# Patient Record
Sex: Female | Born: 1985 | Race: Asian | Hispanic: No | Marital: Married | Smoking: Never smoker
Health system: Southern US, Community
[De-identification: ages and names within clinical notes are randomized; demographics above are authoritative.]

## PROBLEM LIST (undated history)

## (undated) DIAGNOSIS — I10 Essential (primary) hypertension: Secondary | ICD-10-CM

## (undated) DIAGNOSIS — E119 Type 2 diabetes mellitus without complications: Secondary | ICD-10-CM

## (undated) DIAGNOSIS — E2839 Other primary ovarian failure: Secondary | ICD-10-CM

## (undated) DIAGNOSIS — Z79890 Hormone replacement therapy (postmenopausal): Secondary | ICD-10-CM

## (undated) DIAGNOSIS — E288 Other ovarian dysfunction: Secondary | ICD-10-CM

## (undated) HISTORY — PX: APPENDECTOMY: SHX54

## (undated) HISTORY — DX: Type 2 diabetes mellitus without complications: E11.9

## (undated) HISTORY — DX: Hormone replacement therapy: Z79.890

---

## 2016-11-26 ENCOUNTER — Encounter: Payer: Self-pay | Admitting: Family Medicine

## 2016-11-26 ENCOUNTER — Ambulatory Visit (INDEPENDENT_AMBULATORY_CARE_PROVIDER_SITE_OTHER): Payer: 59 | Admitting: Family Medicine

## 2016-11-26 VITALS — BP 110/62 | HR 72 | Ht 62.0 in | Wt 161.0 lb

## 2016-11-26 DIAGNOSIS — Z7689 Persons encountering health services in other specified circumstances: Secondary | ICD-10-CM | POA: Diagnosis not present

## 2016-11-26 DIAGNOSIS — E2839 Other primary ovarian failure: Secondary | ICD-10-CM | POA: Diagnosis not present

## 2016-11-26 DIAGNOSIS — E049 Nontoxic goiter, unspecified: Secondary | ICD-10-CM

## 2016-11-26 DIAGNOSIS — E119 Type 2 diabetes mellitus without complications: Secondary | ICD-10-CM | POA: Diagnosis not present

## 2016-11-26 DIAGNOSIS — N970 Female infertility associated with anovulation: Secondary | ICD-10-CM | POA: Diagnosis not present

## 2016-11-26 MED ORDER — METFORMIN HCL 500 MG PO TABS: 500.0000 mg | ORAL_TABLET | Freq: Two times a day (BID) | ORAL | 6 refills | Status: DC

## 2016-11-26 NOTE — Progress Notes (Signed)
Name: [name]   MRN: [id number]    DOB: [birth year]   Date:11/26/2016       Progress Note  Subjective  Chief Complaint  Chief Complaint  Patient presents with  . Establish Care  . Diabetes    needs Metformin refilled  . hormone replacement therapy    currently using a cream    Diabetes  She presents for her follow-up diabetic visit. She has type 2 diabetes mellitus. Her disease course has been stable. There are no hypoglycemic associated symptoms. Pertinent negatives for hypoglycemia include no dizziness, headaches or nervousness/anxiousness. Pertinent negatives for diabetes include no blurred vision, no chest pain, no fatigue, no foot paresthesias, no foot ulcerations, no polydipsia, no polyphagia, no polyuria, no visual change, no weakness and no weight loss. There are no hypoglycemic complications. Symptoms are stable. There are no diabetic complications. Risk factors for coronary artery disease include diabetes mellitus. Current diabetic treatment includes oral agent (monotherapy). Her weight is stable. She participates in exercise daily. Her breakfast blood glucose is taken between 7-8 am.    No problem-specific Assessment & Plan notes found for this encounter.   Past Medical History:  Diagnosis Date  . Diabetes mellitus without complication (HCC)   . Hormone replacement therapy     Past Surgical History:  Procedure Laterality Date  . APPENDECTOMY      Family History  Problem Relation Age of Onset  . Diabetes Mother   . Hypertension Mother     Social History   Social History  . Marital status: Married    Spouse name: N/A  . Number of children: N/A  . Years of education: N/A   Occupational History  . Not on file.   Social History Main Topics  . Smoking status: Never Smoker  . Smokeless tobacco: Never Used  . Alcohol use No  . Drug use: No  . Sexual activity: No   Other Topics Concern  . Not on file   Social History Narrative  . No narrative on  file    No Known Allergies   Review of Systems  Constitutional: Negative for chills, fatigue, fever, malaise/fatigue and weight loss.  HENT: Negative for ear discharge, ear pain and sore throat.   Eyes: Negative for blurred vision.  Respiratory: Negative for cough, sputum production, shortness of breath and wheezing.   Cardiovascular: Negative for chest pain, palpitations and leg swelling.  Gastrointestinal: Negative for abdominal pain, blood in stool, constipation, diarrhea, heartburn, melena and nausea.  Genitourinary: Negative for dysuria, frequency, hematuria and urgency.  Musculoskeletal: Negative for back pain, joint pain, myalgias and neck pain.  Skin: Negative for rash.  Neurological: Negative for dizziness, tingling, sensory change, focal weakness, weakness and headaches.  Endo/Heme/Allergies: Negative for environmental allergies, polydipsia and polyphagia. Does not bruise/bleed easily.  Psychiatric/Behavioral: Negative for depression and suicidal ideas. The patient is not nervous/anxious and does not have insomnia.      Objective  Vitals:   11/26/16 1437  BP: 110/62  Pulse: 72  Weight: 161 lb (73 kg)  Height: 5\' 2"  (1.575 m)    Physical Exam  Constitutional: She is well-developed, well-nourished, and in no distress. No distress.  HENT:  Head: Normocephalic and atraumatic.  Right Ear: Tympanic membrane, external ear and ear canal normal.  Left Ear: Tympanic membrane, external ear and ear canal normal.  Nose: Nose normal.  Mouth/Throat: Oropharynx is clear and moist.  Eyes: Conjunctivae and EOM are normal. Pupils are equal, round, and reactive to light. Right eye  exhibits no discharge. Left eye exhibits no discharge.  Neck: Normal range of motion. Neck supple. No JVD present. Thyromegaly present.  Cardiovascular: Normal rate, regular rhythm, normal heart sounds and intact distal pulses.  Exam reveals no gallop and no friction rub.   No murmur  heard. Pulmonary/Chest: Effort normal and breath sounds normal.  Abdominal: Soft. Bowel sounds are normal. She exhibits no mass. There is no hepatosplenomegaly. There is no tenderness. There is no guarding.  Musculoskeletal: Normal range of motion. She exhibits no edema.  Lymphadenopathy:    She has no cervical adenopathy.  Neurological: She is alert. She has normal reflexes.  Skin: Skin is warm and dry. She is not diaphoretic.  Psychiatric: Mood and affect normal.  Nursing note reviewed.     Assessment & Plan  Problem List Items Addressed This Visit      Endocrine   Type 2 diabetes mellitus without complication, without long-term current use of insulin (HCC)   Relevant Medications   metFORMIN (GLUCOPHAGE) 500 MG tablet   Other Relevant Orders   Hemoglobin A1c   Renal Function Panel     Genitourinary   Infertility associated with anovulation   Relevant Orders   Ambulatory referral to Gynecology     Other   Hypoestrogenism   Relevant Medications   Misc Natural Products (PROGESTERONE EX)   Other Relevant Orders   Ambulatory referral to Gynecology    Other Visit Diagnoses    Establishing care with new doctor, encounter for    -  Primary   Euthyroid goiter            Dr. Elizabeth Sauereanna Donavan [facility]  11/26/16

## 2016-11-27 LAB — RENAL FUNCTION PANEL
Albumin: 4.3 g/dL (ref 3.5–5.5)
BUN/Creatinine Ratio: 17 (ref 9–23)
BUN: 13 mg/dL (ref 6–20)
CALCIUM: 9.7 mg/dL (ref 8.7–10.2)
CHLORIDE: 100 mmol/L (ref 96–106)
CO2: 27 mmol/L (ref 18–29)
Creatinine, Ser: 0.77 mg/dL (ref 0.57–1.00)
GFR calc Af Amer: 122 mL/min/{1.73_m2} (ref >59)
GFR calc non Af Amer: 105 mL/min/{1.73_m2} (ref >59)
GLUCOSE: 98 mg/dL (ref 65–99)
PHOSPHORUS: 3.8 mg/dL (ref 2.5–4.5)
POTASSIUM: 4.6 mmol/L (ref 3.5–5.2)
SODIUM: 141 mmol/L (ref 134–144)

## 2016-11-27 LAB — HEMOGLOBIN A1C
ESTIMATED AVERAGE GLUCOSE: 143 mg/dL
Hgb A1c MFr Bld: 6.6 % — ABNORMAL HIGH (ref 4.8–5.6)

## 2016-11-30 ENCOUNTER — Other Ambulatory Visit: Payer: Self-pay

## 2016-12-07 ENCOUNTER — Other Ambulatory Visit: Payer: Self-pay

## 2016-12-30 ENCOUNTER — Encounter: Payer: Self-pay | Admitting: Obstetrics and Gynecology

## 2016-12-30 ENCOUNTER — Ambulatory Visit (INDEPENDENT_AMBULATORY_CARE_PROVIDER_SITE_OTHER): Payer: 59 | Admitting: Obstetrics and Gynecology

## 2016-12-30 VITALS — BP 120/75 | HR 86 | Ht 62.0 in | Wt 161.9 lb

## 2016-12-30 DIAGNOSIS — E663 Overweight: Secondary | ICD-10-CM | POA: Diagnosis not present

## 2016-12-30 DIAGNOSIS — N970 Female infertility associated with anovulation: Secondary | ICD-10-CM

## 2016-12-30 DIAGNOSIS — E119 Type 2 diabetes mellitus without complications: Secondary | ICD-10-CM

## 2016-12-30 MED ORDER — MEDROXYPROGESTERONE ACETATE 10 MG PO TABS: 10.0000 mg | ORAL_TABLET | Freq: Every day | ORAL | 6 refills | Status: DC

## 2016-12-30 NOTE — Patient Instructions (Signed)
Take Provera tablets for 7 days.  You should have a period within the following week.  Have intercourse every day or every other day beginning on Day 10-14 of the cycle. Day 1 is the first day of bleeding.      Infertility Infertility is when you are unable to get pregnant (conceive) after a year of having sex regularly without using birth control. Infertility can also mean that a woman is not able to carry a pregnancy to full term. Both women and men can have fertility problems. What causes infertility? What Causes Infertility in Women?  There are many possible causes of infertility in women. For some women, the cause of infertility is not known (unexplained infertility). Infertility can also be linked to more than one cause. Infertility problems in women can be caused by problems with the menstrual cycle or reproductive organs, certain medical conditions, and factors related to lifestyle and age.  Problems with your menstrual cycle can interfere with your ovaries producing eggs (ovulation). This can make it difficult to get pregnant. This includes having a menstrual cycle that is very long, very short, or irregular.  Problems with reproductive organs can include:  An abnormally narrow cervix or a cervix that does not remain closed during a pregnancy.  A blockage in your fallopian tubes.  An abnormally shaped uterus.  Uterine fibroids. This is a tissue mass (tumor) that can develop on your uterus.  Medical conditions that can affect a woman's fertility include:  Polycystic ovarian syndrome (PCOS). This is a hormonal disorder that can cause small cysts to grow on your ovaries. This is the most common cause of infertility in women.  Endometriosis. This is a condition in which the tissue that lines your uterus (endometrium) grows outside of its normal location.  Primary ovary insufficiency. This is when your ovaries stop producing eggs and hormones before the age of 54.  Sexually  transmitted diseases, such as chlamydia or gonorrhea. These infections can cause scarring in your fallopian tubes. This makes it difficult for eggs to reach your uterus.  Autoimmune disorders. These are disorders in which your immune system attacks normal, healthy cells.  Hormone imbalances.  Other factors include:  Age. A woman's fertility declines with age, especially after her mid-30s.  Being under- or overweight.  Drinking too much alcohol.  Using drugs.  Exercising excessively.  Being exposed to environmental toxins, such as radiation, pesticides, and certain chemicals. What Causes Infertility in Men?  There are many causes of infertility in men. Infertility can be linked to more than one cause. Infertility problems in men can be caused by problems with sperm or the reproductive organs, certain medical conditions, and factors related to lifestyle and age. Some men have unexplained infertility.  Problems with sperm. Infertility can result if there is a problem producing:  Enough sperm (low sperm count).  Enough normally-shaped sperm (sperm morphology).  Sperm that are able to reach the egg (poor motility).  Infertility can also be caused by:  A problem with hormones.  Enlarged veins (varicoceles), cysts (spermatoceles), or tumors of the testicles.  Sexual dysfunction.  Injury to the testicles.  A birth defect, such as not having the tubes that carry sperm (vas deferens).  Medical conditions that can affect a man's fertility include:  Diabetes.  Cancer treatments, such as chemotherapy or radiation.  Klinefelter syndrome. This is an inherited genetic disorder.  Thyroid problems, such as an under- or overactive thyroid.  Cystic fibrosis.  Sexually transmitted diseases.  Other factors  include:  Age. A man's fertility declines with age.  Drinking too much alcohol.  Using drugs.  Being exposed to environmental toxins, such as pesticides and lead. What  are the symptoms of infertility? Being unable to get pregnant after one year of having regular sex without using birth control is the only sign of infertility. How is infertility diagnosed? In order to be diagnosed with infertility, both partners will have a physical exam. Both partners will also have an extensive medical and sexual history taken. If there is no obvious reason for infertility, additional tests may be done. What Tests Will Women Have?  Women may first have tests to check whether they are ovulating each month. The tests may include:  Blood tests to check hormone levels.  An ultrasound of the ovaries. This looks for possible problems on or in the ovaries.  Taking a small sample of the tissue that lines the uterus for examination under a microscope (endometrial biopsy). Women who are ovulating may have additional tests. These may include:  Hysterosalpingography.  This is an X-ray of the fallopian tubes and uterus taken after a specific type of dye is injected.  This test can show the shape of the uterus and whether the fallopian tubes are open.  Laparoscopy.  In this test, a lighted tube (laparoscope) is used to look for problems in the fallopian tubes and other female organs.  Transvaginal ultrasound.  This is an imaging test to check for abnormalities of the uterus and ovaries.  A health care provider can use this test to count the number of follicles on the ovaries.  Hysteroscopy.  This test involves using a lighted tube to examine the cervix and inside the uterus.  It is done to find any abnormalities inside the uterus. What Tests Will Men Have?  Tests for men's infertility includes:  Semen tests to check sperm count, morphology, and motility.  Blood tests to check for hormone levels.  Taking a small sample of tissue from inside a testicle (biopsy). This is examined under a microscope.  Blood tests to check for genetic abnormalities (genetic testing). How  are women treated for infertility? Treatment depends on the cause of infertility. Most cases of infertility in women are treated with medicine or surgery.  Women may take medicine to:  Correct ovulation problems.  Treat other health conditions, such as PCOS.  Surgery may be done to:  Repair damage to the ovaries, fallopian tubes, cervix, or uterus.  Remove growths from the uterus.  Remove scar tissue from the uterus, pelvis, or other female organs. How are men treated for infertility? Treatment depends on the cause of infertility. Most cases of infertility in men are treated with medicine or surgery.  Men may take medicine to:  Correct hormone problems.  Treat other health conditions.  Treat sexual dysfunction.  Surgery may be done to:  Remove blockages in the reproductive tract.  Correct other structural problems of the reproductive tract. What is assisted reproductive technology? Assisted reproductive technology (ART) refers to all treatments and procedures that combine eggs and sperm outside the body to try to help a couple conceive. ART is often combined with fertility drugs to stimulate ovulation. Sometimes ART is done using eggs retrieved from another woman's body (donor eggs) or from previously frozen fertilized eggs (embryos). There are different types of ART. These include:  Intrauterine insemination (IUI).  In this procedure, sperm is placed directly into a woman's uterus with a long, thin tube.  This may be most  effective for infertility caused by sperm problems, including low sperm count and low motility.  Can be used in combination with fertility drugs.  In vitro fertilization (IVF).  This is often done when a woman's fallopian tubes are blocked or when a man has low sperm counts.  Fertility drugs stimulate the ovaries to produce multiple eggs. Once mature, these eggs are removed from the body and combined with the sperm to be fertilized.  These  fertilized eggs are then placed in the woman's uterus. This information is not intended to replace advice given to you by your health care provider. Make sure you discuss any questions you have with your health care provider. Document Released: 11/05/2003 Document Revised: 04/03/2016 Document Reviewed: 07/18/2014 Elsevier Interactive Patient Education  2017 [employer]

## 2016-12-30 NOTE — Progress Notes (Signed)
GYNECOLOGY PROGRESS NOTE  Subjective:    Patient ID: [name], female    DOB: 07-08-1986, 31 y.o.   MRN: [id number]  HPI  Patient is a 31 y.o. female who presents as a referral from [facility](Dr. Elizabeth Sauer) for infertility due to anovulation.   Patient reports that she used to have regular periods up until ~ 2 years ago when she moved from [place] to [place].  Notes that after the move, she began to gain weight (~ 30-40 lbs) and her cycles began to become more irregular (q 2-4 months).  Notes that se was diagnosed with DM last year, and so has been trying to work on exercising and weight loss to decrease morbidity (has lost almost 20 lbs so far). She has never been pregnant before, and denies any history of pelvic surgeries, infections.  Husband has never fathered a child.  Notes that she and her husband tried for ~ 1 year without success, stopped trying after she was diagnosed with diabetes as she wanted to be healthier.  Has recently started trying again 2-3 months ago.    Past Medical History:  Diagnosis Date  . Diabetes mellitus without complication (HCC)   . Hormone replacement therapy     Family History  Problem Relation Age of Onset  . Diabetes Mother   . Hypertension Mother     Past Surgical History:  Procedure Laterality Date  . APPENDECTOMY      Social History   Social History  . Marital status: Married    Spouse name: N/A  . Number of children: N/A  . Years of education: N/A   Occupational History  . Not on file.   Social History Main Topics  . Smoking status: Never Smoker  . Smokeless tobacco: Never Used  . Alcohol use No  . Drug use: No  . Sexual activity: Yes    Birth control/ protection: None   Other Topics Concern  . Not on file   Social History Narrative  . No narrative on file    Current Outpatient Prescriptions on File Prior to Visit  Medication Sig Dispense Refill  . metFORMIN (GLUCOPHAGE) 500 MG tablet Take 1  tablet (500 mg total) by mouth 2 (two) times daily with a meal. 60 tablet 6  . Misc Natural Products (PROGESTERONE EX) Apply topically. Apply 1 pump at bedtime for 2 weeks of cycle, 2 pumps days 15-18, 3 pumps day 19-22, 2 pumps day 23-24, 1 pump day 25, hold for 3-7 days     No current facility-administered medications on file prior to visit.     No Known Allergies   Review of Systems Pertinent items noted in HPI and remainder of comprehensive ROS otherwise negative.   Objective:   Blood pressure 120/75, pulse 86, height 5\' 2"  (1.575 m), weight 161 lb 14.4 oz (73.4 kg), last menstrual period 09/04/2016.  Body mass index is 29.61 kg/m.  General appearance: alert and no distress Abdomen: soft, non-tender; bowel sounds normal; no masses,  no organomegaly Pelvic: deferred   Labs:  Lab Results  Component Value Date   CREATININE 0.77 11/26/2016   BUN 13 11/26/2016   NA 141 11/26/2016   K 4.6 11/26/2016   CL 100 11/26/2016   CO2 27 11/26/2016   Lab Results  Component Value Date   HGBA1C 6.6 (H) 11/26/2016    Assessment:   Anovulatory bleeding Infertility DM Overweight  Plan:   1. Anovulatory bleeding.  Patient notes that she is  currently using Progesterone cream but does not feel it is helping.  Discussed cessation of cream, and will start on provera tablets for monthly withdrawal bleed. Also continued to encourage weight gain as this can help regulate her cycles and increase chances of fertility. Discussed differential diagnosis.  2.  Infertility workup ordered.  Labs ordered, patient has had a normal ultrasound performed by PCP recently, noting no structural abnormalities.   Discussed that once cycles become more regular using Provera tablets (or come spontaneously), can begin timed coitus.  Given menstrual calendar in order to also chart basal body temperatures.  Also informed patient that she could also use ovulation kits.  3. DM - HgbA1c currently mildly elevated.  Discussed importance of tight glucose control with regards to diabetes and fertility, as well as decreasing the risk of fetal anomalies associated with uncontrolled DM.   4. Overweight - Continue to encourage weight loss and exercise.   RTC in 3 months.     A total of 30 minutes were spent face-to-face with the patient during the encounter with greater than 50% dealing with counseling and coordination of care.   Hildred LaserAnika Tyshaun Vinzant, MD [facility]

## 2017-01-01 LAB — TESTOSTERONE, FREE, TOTAL, SHBG
Sex Hormone Binding: 20.2 nmol/L — ABNORMAL LOW (ref 24.6–122.0)
TESTOSTERONE: 73 ng/dL — AB (ref 8–48)
Testosterone, Free: 1.3 pg/mL (ref 0.0–4.2)

## 2017-01-01 LAB — TSH: TSH: 6.14 u[IU]/mL — AB (ref 0.450–4.500)

## 2017-01-01 LAB — ESTRADIOL: Estradiol: <5 pg/mL

## 2017-01-01 LAB — FSH/LH
FSH: 81.7 m[IU]/mL
LH: 39.2 m[IU]/mL

## 2017-01-01 LAB — DHEA-SULFATE: DHEA-SO4: 300.4 ug/dL (ref 84.8–378.0)

## 2017-01-01 LAB — PROLACTIN: Prolactin: 7.4 ng/mL (ref 4.8–23.3)

## 2017-01-01 LAB — PROGESTERONE

## 2017-01-04 ENCOUNTER — Telehealth: Payer: Self-pay

## 2017-01-04 DIAGNOSIS — R7989 Other specified abnormal findings of blood chemistry: Secondary | ICD-10-CM

## 2017-01-04 DIAGNOSIS — Z319 Encounter for procreative management, unspecified: Secondary | ICD-10-CM

## 2017-01-04 NOTE — Telephone Encounter (Signed)
Called pt informed her of the information below. Pt has several questions regarding labs, advised pt that she needs to speak with provider. Additional lab work ordered. Pt states she will make f/u appt for additional concerns.

## 2017-01-04 NOTE — Telephone Encounter (Signed)
-----   Message from Hildred LaserAnika Cherry, MD sent at 01/01/2017 10:17 AM EST ----- Please inform patient that several of her labs returned abnormal:   Her thyroid hormone level is elevated.  We need to now get a free T4 ordered.   Her estrogen and progesterone levels are low. This could be due to her not producing enough of the hormones, or it could be due to her thyroid hormone suppressing the other hormones.  We will find out more once the T4 comes back.  Based on her FSH and LH levels, we also need to test for her ovarian reserve (need to order AMH, which is anti-mullerian hormone) Her testosterone level is slightly elevated, which in the grand scheme should not affect her fertility, but may also be due to her thyroid hormone.   She needs to continue taking the Provera tablets for now and let me know if she does not have a period after completion.

## 2017-01-08 ENCOUNTER — Other Ambulatory Visit: Payer: Self-pay | Admitting: Obstetrics and Gynecology

## 2017-01-08 ENCOUNTER — Other Ambulatory Visit: Payer: 59

## 2017-01-09 LAB — T4, FREE: Free T4: 1.27 ng/dL (ref 0.82–1.77)

## 2017-01-11 LAB — ANTI MULLERIAN HORMONE: ANTI-MULLERIAN HORMONE (AMH): <0.015 ng/mL

## 2017-01-12 ENCOUNTER — Telehealth: Payer: Self-pay | Admitting: Obstetrics and Gynecology

## 2017-01-12 ENCOUNTER — Encounter: Payer: 59 | Admitting: Obstetrics and Gynecology

## 2017-01-12 DIAGNOSIS — N912 Amenorrhea, unspecified: Secondary | ICD-10-CM

## 2017-01-12 MED ORDER — MEDROXYPROGESTERONE ACETATE 10 MG PO TABS: 10.0000 mg | ORAL_TABLET | Freq: Every day | ORAL | 6 refills | Status: DC

## 2017-01-12 NOTE — Telephone Encounter (Signed)
Called pt she states that she has done 7 days of provera (2/18-2/24) but still has not had a period. Per Dr. Valentino Saxonherry pt to do another provera challenge for 10 x days. Rx sent in. Pt to call back if still no bleeding.

## 2017-01-12 NOTE — Telephone Encounter (Signed)
Pt called and would like a call back from you °

## 2017-01-15 ENCOUNTER — Telehealth: Payer: Self-pay | Admitting: Obstetrics and Gynecology

## 2017-01-15 NOTE — Telephone Encounter (Signed)
error 

## 2017-01-21 ENCOUNTER — Encounter: Payer: Self-pay | Admitting: Obstetrics and Gynecology

## 2017-01-21 ENCOUNTER — Ambulatory Visit (INDEPENDENT_AMBULATORY_CARE_PROVIDER_SITE_OTHER): Payer: 59 | Admitting: Obstetrics and Gynecology

## 2017-01-21 VITALS — BP 113/69 | HR 65 | Ht 62.0 in | Wt 162.6 lb

## 2017-01-21 DIAGNOSIS — N970 Female infertility associated with anovulation: Secondary | ICD-10-CM

## 2017-01-21 DIAGNOSIS — R946 Abnormal results of thyroid function studies: Secondary | ICD-10-CM

## 2017-01-21 DIAGNOSIS — R7989 Other specified abnormal findings of blood chemistry: Secondary | ICD-10-CM

## 2017-01-21 NOTE — Progress Notes (Signed)
GYNECOLOGY PROGRESS NOTE  Subjective:    Patient ID: [name], female    DOB: Aug 25, 1986, 31 y.o.   MRN: [id number]  HPI  Patient is a 31 y.o. G0P0000 female with h/o DM who presents for f/u for discussion of lab results.  Patient currently undergoing workup for infertility and secondary amenorrhea.  Notes that she did not have withdrawal bleed on provera challenge.   The following portions of the patient's history were reviewed and updated as appropriate: allergies, current medications, past family history, past medical history, past social history, past surgical history and problem list.  Review of Systems Pertinent items noted in HPI and remainder of comprehensive ROS otherwise negative.   Objective:   Blood pressure 113/69, pulse 65, height 5\' 2"  (1.575 m), weight 162 lb 9.6 oz (73.8 kg). Body mass index is 29.74 kg/m.  General appearance: alert and no distress Remainder of exam deferred.     Labs:  Results for Heather, Montes (MRN [id number]) as of 01/22/2017 23:11  Ref. Range 11/26/2016 15:48 12/30/2016 14:22 01/08/2017 00:00  DHEA-SO4 Latest Ref Range: 84.8 - 378.0 ug/dL  782.9   LH Latest Units: mIU/mL  39.2   FSH Latest Units: mIU/mL  81.7   Prolactin Latest Ref Range: 4.8 - 23.3 ng/mL  7.4   Glucose Latest Ref Range: 65 - 99 mg/dL 98    Hemoglobin F6O Latest Ref Range: 4.8 - 5.6 % 6.6 (H)    ANTI-MULLERIAN HORMONE (AMH) Latest Units: ng/mL   <0.015  Estradiol Latest Units: pg/mL  <5.0   Progesterone Latest Units: ng/mL  <0.1   Sex Horm Binding Glob, Serum Latest Ref Range: 24.6 - 122.0 nmol/L  20.2 (L)   Testosterone Latest Ref Range: 8 - 48 ng/dL  73 (H)   Testosterone Free Latest Ref Range: 0.0 - 4.2 pg/mL  1.3   TSH Latest Ref Range: 0.450 - 4.500 uIU/mL  6.140 (H)   T4,Free(Direct) Latest Ref Range: 0.82 - 1.77 ng/dL   1.30    Assessment:   Infertility Amenorrhea Hyperandrogenism (mild) Diabetes mellitus Overweight Elevated TSH  Plan:    1. Infertility - likely secondary to anovulation.  Workup noted very low AMH levels (according to age, normal range should be ~ 1 - 11).   Discussed that she may likely need f/u or consultation with an REI specialist.  Will refer to [facility] (closer to where patient lives).    2. Amenorrhea - likely secondary to anovulatory syndrome.  Patient failed Provera challenge.  Will start on a 1 month trial of OCPs to see if bleeding occurs during hormone free week.  PCOS less likely cause as patient had regular monthly cycles up until ~ 2 years ago when she relocated from [place].   3. Hyperandrogenism - mildly elevated testosterone levels noted.  May or may not be a factor in reason for infertility.    4. Diabetes - patient was diagnosed with DM last year, has been working on it using diet and exercise. Is also currently on metformin. Most recent HgbA1c is 6.6.  Advised patient to get levels down to at least a 6% to optimize diabetic control during a pregnancy and decrease associated risk factors.   5. Overweight - patient notes that she is working on weight loss (gained ` 30-40 lbs over the last 2 years after migrating to [place]).  Can play a role in infertility.   6. Elevated TSH - followed by normal Free T4.  Advised patient to f/u in 4  more weeks for repeat TSH level.    A total of 25 minutes were spent face-to-face with the patient during this encounter and over half of that time involved counseling and coordination of care.   Hildred LaserAnika Rakeem Colley, MD [facility]

## 2017-02-02 ENCOUNTER — Telehealth: Payer: Self-pay | Admitting: Obstetrics and Gynecology

## 2017-02-02 NOTE — Telephone Encounter (Signed)
Needs to speak to someone regarding a referral to a specialist.

## 2017-02-05 NOTE — Telephone Encounter (Signed)
Spoke with patient. [facility] information [facility].

## 2017-02-18 ENCOUNTER — Other Ambulatory Visit: Payer: 59

## 2017-03-02 ENCOUNTER — Other Ambulatory Visit: Payer: Self-pay

## 2017-03-02 MED ORDER — CONJ ESTROG-MEDROXYPROGEST ACE PO TABS: 1.0000 | ORAL_TABLET | Freq: Every day | ORAL | 0 refills | Status: DC

## 2017-04-01 ENCOUNTER — Encounter: Payer: 59 | Admitting: Obstetrics and Gynecology

## 2017-08-20 ENCOUNTER — Encounter: Payer: Self-pay | Admitting: Obstetrics and Gynecology

## 2017-08-20 ENCOUNTER — Ambulatory Visit (INDEPENDENT_AMBULATORY_CARE_PROVIDER_SITE_OTHER): Payer: 59 | Admitting: Obstetrics and Gynecology

## 2017-08-20 VITALS — BP 119/77 | HR 90 | Wt 170.1 lb

## 2017-08-20 DIAGNOSIS — E669 Obesity, unspecified: Secondary | ICD-10-CM

## 2017-08-20 DIAGNOSIS — Z3401 Encounter for supervision of normal first pregnancy, first trimester: Secondary | ICD-10-CM

## 2017-08-20 DIAGNOSIS — E2839 Other primary ovarian failure: Secondary | ICD-10-CM

## 2017-08-20 DIAGNOSIS — E119 Type 2 diabetes mellitus without complications: Secondary | ICD-10-CM

## 2017-08-20 DIAGNOSIS — Z113 Encounter for screening for infections with a predominantly sexual mode of transmission: Secondary | ICD-10-CM

## 2017-08-20 NOTE — Progress Notes (Signed)
Pt presents for nob Pe.  [facility]F in at [facility] in [place]. U/s on 9/7 and 9/18- showed IUP with  edd -03/15/2018. Spotting noted after each u/s. NO n/v noted.

## 2017-08-20 NOTE — Progress Notes (Signed)
OBSTETRIC INITIAL PRENATAL VISIT  Subjective:    [name] is being seen today for her first obstetrical visit.  This is a planned pregnancy (through IVF with donor egg due to patient's recent diagnosis of early onset menopause, performed at [facility]). She is a G1P0 female at [redacted]w[redacted]d gestation, Estimated Date of Delivery: 03/15/18 by 6 week ultrasound. Her obstetrical history is significant for obesity and Type II DM, crurently on Metformin. Relationship with FOB: spouse, living together. Patient does intend to breast feed. Pregnancy history fully reviewed.    Obstetric History   G1   P0   T0   P0   A0   L0    SAB0   TAB0   Ectopic0   Multiple0   Live Births0     # Outcome Date GA Lbr Len/2nd Weight Sex Delivery Anes PTL Lv  1 Current               Gynecologic History:  Last pap smear was 2017.  Results were normal.  Denies h/o abnormal pap smears in the past.  Denies history of STIs.    Past Medical History:  Diagnosis Date  . Diabetes mellitus without complication (HCC)   . Hormone replacement therapy      Family History  Problem Relation Age of Onset  . Diabetes Mother   . Hypertension Mother      Past Surgical History:  Procedure Laterality Date  . APPENDECTOMY       Social History   Social History  . Marital status: Married    Spouse name: N/A  . Number of children: N/A  . Years of education: N/A   Occupational History  . Not on file.   Social History Main Topics  . Smoking status: Never Smoker  . Smokeless tobacco: Never Used  . Alcohol use No  . Drug use: No  . Sexual activity: Yes    Birth control/ protection: None     Comment: Trying to conceive    Other Topics Concern  . Not on file   Social History Narrative  . No narrative on file     Current Outpatient Prescriptions on File Prior to Visit  Medication Sig Dispense Refill  . metFORMIN (GLUCOPHAGE) 500 MG tablet Take 1 tablet (500 mg total) by mouth 2 (two)  times daily with a meal. 60 tablet 6  . Probiotic Product (PROBIOTIC ADVANCED PO)      No current facility-administered medications on file prior to visit.      No Known Allergies   Review of Systems General:Not Present- Fever, Weight Loss and Weight Gain. Skin:Not Present- Rash. HEENT:Not Present- Blurred Vision, Headache and Bleeding Gums. Respiratory:Not Present- Difficulty Breathing. Breast:Not Present- Breast Mass. Cardiovascular:Not Present- Chest Pain, Elevated Blood Pressure, Fainting / Blacking Out and Shortness of Breath. Gastrointestinal:Not Present- Abdominal Pain, Constipation, Nausea and Vomiting. Female Genitourinary:Not Present- Frequency, Painful Urination, Pelvic Pain, Vaginal Bleeding, Vaginal Discharge, Contractions, regular, Fetal Movements Decreased, Urinary Complaints and Vaginal Fluid. Musculoskeletal:Not Present- Back Pain and Leg Cramps. Neurological:Not Present- Dizziness. Psychiatric:Not Present- Depression.     Objective:   Blood pressure 119/77, pulse 90, weight 170 lb 1.6 oz (77.2 kg), last menstrual period 09/04/2016.  Body mass index is 31.11 kg/m.   General Appearance:    Alert, cooperative, no distress, appears stated age, mildly obese.   Head:    Normocephalic, without obvious abnormality, atraumatic  Eyes:    PERRL, conjunctiva/corneas clear, EOM's intact, both eyes  Ears:  Normal external ear canals, both ears  Nose:   Nares normal, septum midline, mucosa normal, no drainage or sinus tenderness  Throat:   Lips, mucosa, and tongue normal; teeth and gums normal  Neck:   Supple, symmetrical, trachea midline, no adenopathy; thyroid: no enlargement/tenderness/nodules; no carotid bruit or JVD  Back:     Symmetric, no curvature, ROM normal, no CVA tenderness  Lungs:     Clear to auscultation bilaterally, respirations unlabored  Chest Wall:    No tenderness or deformity   Heart:    Regular rate and rhythm, S1 and S2 normal, no murmur,  rub or gallop  Breast Exam:    No tenderness, masses, or nipple abnormality  Abdomen:     Soft, non-tender, bowel sounds active all four quadrants, no masses, no organomegaly.  FHT 168 bpm (by unofficial sono).  Genitalia:    Pelvic:external genitalia normal, vagina without lesions, discharge, or tenderness, rectovaginal septum  normal. Cervix normal in appearance, no cervical motion tenderness, no adnexal masses or tenderness.  Pregnancy positive findings: uterine enlargement: 10 wk size, nontender.   Rectal:    Normal external sphincter.  No hemorrhoids appreciated. Internal exam not done.   Extremities:   Extremities normal, atraumatic, no cyanosis or edema  Pulses:   2+ and symmetric all extremities  Skin:   Skin color, texture, turgor normal, no rashes or lesions  Lymph nodes:   Cervical, supraclavicular, and axillary nodes normal  Neurologic:   CNII-XII intact, normal strength, sensation and reflexes throughout      Assessment:    Pregnancy at 10 and 3/7 weeks   IVF pregnancy  Type II DM  Obesity, Type 1  Plan:   - Initial labs ordered. - Prenatal vitamins encouraged. - Problem list reviewed and updated. - New OB counseling:  The patient has been given an overview regarding routine prenatal care.  - Recommendations regarding diet, weight gain, and exercise in pregnancy were given. - Prenatal testing, optional genetic testing, and ultrasound use in pregnancy were reviewed.  Patient reports genetic testing being done during the process of IVF.  Will attempt to get records from [facility]. Has just recently discontinued recommended hormonal therapy, per recommendations.  - Type II DM, currently controlled on Metformin.  Patient currently checks blood sugars ~ once weekly. Discussed increasing blood glucose monitoring to 4 x daily (fasting and 2 hours postprandial for each meal), and recording values. Will need antenatal testing and ultrasound beginning at [redacted] weeks gestation.   - Benefits of Breast Feeding were discussed. The patient is encouraged to consider nursing her baby post partum. - Follow up in 4 weeks.  50% of 30 min visit spent on counseling and coordination of care.     Hildred Laser, MD [facility]

## 2017-08-21 LAB — ANTIBODY SCREEN: ANTIBODY SCREEN: NEGATIVE

## 2017-08-21 LAB — HEMOGLOBIN A1C
Est. average glucose Bld gHb Est-mCnc: 143 mg/dL
Hgb A1c MFr Bld: 6.6 % — ABNORMAL HIGH (ref 4.8–5.6)

## 2017-08-21 LAB — CBC WITH DIFFERENTIAL/PLATELET
BASOS ABS: 0 10*3/uL (ref 0.0–0.2)
Basos: 0 %
EOS (ABSOLUTE): 0.1 10*3/uL (ref 0.0–0.4)
Eos: 1 %
HEMATOCRIT: 35.8 % (ref 34.0–46.6)
Hemoglobin: 11.9 g/dL (ref 11.1–15.9)
Immature Grans (Abs): 0 10*3/uL (ref 0.0–0.1)
Immature Granulocytes: 0 %
LYMPHS ABS: 2.5 10*3/uL (ref 0.7–3.1)
Lymphs: 25 %
MCH: 28.5 pg (ref 26.6–33.0)
MCHC: 33.2 g/dL (ref 31.5–35.7)
MCV: 86 fL (ref 79–97)
MONOS ABS: 0.4 10*3/uL (ref 0.1–0.9)
Monocytes: 4 %
NEUTROS PCT: 70 %
Neutrophils Absolute: 7 10*3/uL (ref 1.4–7.0)
PLATELETS: 313 10*3/uL (ref 150–379)
RBC: 4.18 x10E6/uL (ref 3.77–5.28)
RDW: 13.6 % (ref 12.3–15.4)
WBC: 10.1 10*3/uL (ref 3.4–10.8)

## 2017-08-21 LAB — URINALYSIS, ROUTINE W REFLEX MICROSCOPIC
BILIRUBIN UA: NEGATIVE
GLUCOSE, UA: NEGATIVE
Ketones, UA: NEGATIVE
Leukocytes, UA: NEGATIVE
NITRITE UA: NEGATIVE
PROTEIN UA: NEGATIVE
RBC, UA: NEGATIVE
Specific Gravity, UA: <=1.005 — AB (ref 1.005–1.030)
Urobilinogen, Ur: 0.2 mg/dL (ref 0.2–1.0)
pH, UA: 6.5 (ref 5.0–7.5)

## 2017-08-21 LAB — GLUCOSE, RANDOM: GLUCOSE: 165 mg/dL — AB (ref 65–99)

## 2017-08-21 LAB — RUBELLA SCREEN: Rubella Antibodies, IGG: 16.2 index (ref >0.99)

## 2017-08-21 LAB — ABO AND RH: RH TYPE: POSITIVE

## 2017-08-21 LAB — NICOTINE SCREEN, URINE: Cotinine Ql Scrn, Ur: NEGATIVE ng/mL

## 2017-08-21 LAB — TSH: TSH: 2.14 u[IU]/mL (ref 0.450–4.500)

## 2017-08-21 LAB — RPR: RPR Ser Ql: NONREACTIVE

## 2017-08-21 LAB — DRUG PROFILE, UR, 9 DRUGS (LABCORP)
Amphetamines, Urine: NEGATIVE ng/mL
BARBITURATE QUANT UR: NEGATIVE ng/mL
Benzodiazepine Quant, Ur: NEGATIVE ng/mL
Cannabinoid Quant, Ur: NEGATIVE ng/mL
Cocaine (Metab.): NEGATIVE ng/mL
Methadone Screen, Urine: NEGATIVE ng/mL
OPIATE QUANT UR: NEGATIVE ng/mL
PCP QUANT UR: NEGATIVE ng/mL
PROPOXYPHENE: NEGATIVE ng/mL

## 2017-08-21 LAB — VARICELLA ZOSTER ANTIBODY, IGG: Varicella zoster IgG: <135 index — ABNORMAL LOW (ref >165)

## 2017-08-21 LAB — HIV ANTIBODY (ROUTINE TESTING W REFLEX): HIV Screen 4th Generation wRfx: NONREACTIVE

## 2017-08-21 LAB — HEPATITIS B SURFACE ANTIGEN: HEP B S AG: NEGATIVE

## 2017-08-22 LAB — CULTURE, OB URINE

## 2017-08-22 LAB — URINE CULTURE, OB REFLEX

## 2017-08-24 LAB — GC/CHLAMYDIA PROBE AMP
CHLAMYDIA, DNA PROBE: NEGATIVE
Neisseria gonorrhoeae by PCR: NEGATIVE

## 2017-08-27 ENCOUNTER — Ambulatory Visit (INDEPENDENT_AMBULATORY_CARE_PROVIDER_SITE_OTHER): Payer: 59 | Admitting: Family Medicine

## 2017-08-27 ENCOUNTER — Encounter: Payer: Self-pay | Admitting: Family Medicine

## 2017-08-27 VITALS — BP 100/64 | HR 70 | Ht 62.0 in | Wt 172.0 lb

## 2017-08-27 DIAGNOSIS — E119 Type 2 diabetes mellitus without complications: Secondary | ICD-10-CM | POA: Diagnosis not present

## 2017-08-27 MED ORDER — METFORMIN HCL 500 MG PO TABS: 500.0000 mg | ORAL_TABLET | Freq: Two times a day (BID) | ORAL | 8 refills | Status: DC

## 2017-08-27 NOTE — Progress Notes (Signed)
Name: [name]   MRN: [id number]    DOB: [birth year]   Date:08/27/2017       Progress Note  Subjective  Chief Complaint  Chief Complaint  Patient presents with  . Diabetes    refill med    Diabetes  She presents for her follow-up diabetic visit. She has type 2 diabetes mellitus. Her disease course has been stable. There are no hypoglycemic associated symptoms. Pertinent negatives for hypoglycemia include no dizziness, headaches or nervousness/anxiousness. Pertinent negatives for diabetes include no blurred vision, no chest pain, no fatigue, no foot paresthesias, no foot ulcerations, no polydipsia, no polyphagia, no polyuria, no visual change, no weakness and no weight loss. There are no hypoglycemic complications. Symptoms are stable. There are no diabetic complications. Risk factors for coronary artery disease include diabetes mellitus. Current diabetic treatment includes oral agent (monotherapy). She is compliant with treatment all of the time. She is following a generally healthy diet. She participates in exercise daily. Her breakfast blood glucose is taken between 7-8 am. Her breakfast blood glucose range is generally 110-130 mg/dl. An ACE inhibitor/angiotensin II receptor blocker is not being taken.    No problem-specific Assessment & Plan notes found for this encounter.   Past Medical History:  Diagnosis Date  . Diabetes mellitus without complication (HCC)   . Hormone replacement therapy     Past Surgical History:  Procedure Laterality Date  . APPENDECTOMY      Family History  Problem Relation Age of Onset  . Diabetes Mother   . Hypertension Mother     Social History   Social History  . Marital status: Married    Spouse name: N/A  . Number of children: N/A  . Years of education: N/A   Occupational History  . Not on file.   Social History Main Topics  . Smoking status: Never Smoker  . Smokeless tobacco: Never Used  . Alcohol use No  . Drug use: No  .  Sexual activity: Yes    Birth control/ protection: None     Comment: Trying to conceive    Other Topics Concern  . Not on file   Social History Narrative  . No narrative on file    No Known Allergies  Outpatient Medications Prior to Visit  Medication Sig Dispense Refill  . DHA-EPA-VITAMIN E PO     . Prenatal Vit-Fe Fumarate-FA (PRENATAL VITAMIN) 27-0.8 MG TABS     . Probiotic Product (PROBIOTIC ADVANCED PO)     . metFORMIN (GLUCOPHAGE) 500 MG tablet Take 1 tablet (500 mg total) by mouth 2 (two) times daily with a meal. 60 tablet 6   No facility-administered medications prior to visit.     Review of Systems  Constitutional: Negative for chills, fatigue, fever, malaise/fatigue and weight loss.       Decreased appetite  HENT: Negative for ear discharge, ear pain and sore throat.   Eyes: Negative for blurred vision.  Respiratory: Negative for cough, sputum production, shortness of breath and wheezing.   Cardiovascular: Negative for chest pain, palpitations and leg swelling.  Gastrointestinal: Positive for constipation. Negative for abdominal pain, blood in stool, diarrhea, heartburn, melena and nausea.  Genitourinary: Negative for dysuria, frequency, hematuria and urgency.  Musculoskeletal: Negative for back pain, joint pain, myalgias and neck pain.  Skin: Negative for rash.  Neurological: Negative for dizziness, tingling, sensory change, focal weakness, weakness and headaches.  Endo/Heme/Allergies: Negative for environmental allergies, polydipsia and polyphagia. Does not bruise/bleed easily.  Psychiatric/Behavioral: Negative for depression  and suicidal ideas. The patient is not nervous/anxious and does not have insomnia.      Objective  Vitals:   08/27/17 1401  BP: 100/64  Pulse: 70  Weight: 172 lb (78 kg)  Height:  (1.575 m)    Physical Exam  Constitutional: She is oriented to person, place, and time and well-developed, well-nourished, and in no distress.   Cardiovascular: Normal heart sounds.   Pulmonary/Chest: Breath sounds normal. No respiratory distress.  Musculoskeletal: Normal range of motion.  Neurological: She is alert and oriented to person, place, and time.  Skin: Skin is warm and dry.  Psychiatric: Affect normal.  Nursing note and vitals reviewed.     Assessment & Plan  Problem List Items Addressed This Visit      Endocrine   Type 2 diabetes mellitus without complication, without long-term current use of insulin (HCC) - Primary   Relevant Medications   metFORMIN (GLUCOPHAGE) 500 MG tablet      Meds ordered this encounter  Medications  . metFORMIN (GLUCOPHAGE) 500 MG tablet    Sig: Take 1 tablet (500 mg total) by mouth 2 (two) times daily with a meal.    Dispense:  60 tablet    Refill:  8      Dr. Hayden [facility]  08/27/17

## 2017-09-17 ENCOUNTER — Ambulatory Visit (INDEPENDENT_AMBULATORY_CARE_PROVIDER_SITE_OTHER): Payer: 59 | Admitting: Obstetrics and Gynecology

## 2017-09-17 ENCOUNTER — Encounter: Payer: Self-pay | Admitting: Obstetrics and Gynecology

## 2017-09-17 VITALS — BP 106/72 | HR 78 | Wt 172.7 lb

## 2017-09-17 DIAGNOSIS — O24312 Unspecified pre-existing diabetes mellitus in pregnancy, second trimester: Secondary | ICD-10-CM

## 2017-09-17 DIAGNOSIS — O24113 Pre-existing diabetes mellitus, type 2, in pregnancy, third trimester: Secondary | ICD-10-CM | POA: Insufficient documentation

## 2017-09-17 DIAGNOSIS — O09812 Supervision of pregnancy resulting from assisted reproductive technology, second trimester: Secondary | ICD-10-CM

## 2017-09-17 DIAGNOSIS — O09819 Supervision of pregnancy resulting from assisted reproductive technology, unspecified trimester: Secondary | ICD-10-CM | POA: Insufficient documentation

## 2017-09-17 LAB — POCT URINALYSIS DIPSTICK
Bilirubin, UA: NEGATIVE
Glucose, UA: NEGATIVE
Ketones, UA: NEGATIVE
Leukocytes, UA: NEGATIVE
NITRITE UA: NEGATIVE
Protein, UA: NEGATIVE
RBC UA: NEGATIVE
Spec Grav, UA: <=1.005 — AB (ref 1.010–1.025)
UROBILINOGEN UA: 0.2 U/dL
pH, UA: 7 (ref 5.0–8.0)

## 2017-09-17 NOTE — Patient Instructions (Signed)
Increase Metformin in evening to 1000 mg.  Check blood sugars 4 times daily (fasting, and 2 hours after each meal)

## 2017-09-17 NOTE — Progress Notes (Signed)
ROB- no complaints.  

## 2017-09-17 NOTE — Progress Notes (Signed)
ROB: Doing well, no complaints. Notes she is not yet compliant with checking blood sugars daily, still checking several times weekly.  Notes her postprandials seem to be well controlled, but fastings are ranging 110-120.  Advised on increasing Metformin to 1000 mg in evening, continue with 500 mg in a.m. Strongly encouraged checking blood sugars as previously recommended. Referral to Diabetes Education.  RTC in 4 weeks. For flu vaccine and anatomy scan next visit.

## 2017-09-27 ENCOUNTER — Encounter: Payer: Self-pay | Admitting: Obstetrics and Gynecology

## 2017-09-27 DIAGNOSIS — Q950 Balanced translocation and insertion in normal individual: Secondary | ICD-10-CM | POA: Insufficient documentation

## 2017-09-30 ENCOUNTER — Ambulatory Visit: Payer: Self-pay | Admitting: *Deleted

## 2017-10-13 ENCOUNTER — Encounter: Payer: 59 | Admitting: Obstetrics and Gynecology

## 2017-10-14 ENCOUNTER — Other Ambulatory Visit: Payer: Self-pay | Admitting: Obstetrics and Gynecology

## 2017-10-14 ENCOUNTER — Ambulatory Visit (INDEPENDENT_AMBULATORY_CARE_PROVIDER_SITE_OTHER): Payer: 59 | Admitting: Obstetrics and Gynecology

## 2017-10-14 ENCOUNTER — Ambulatory Visit (INDEPENDENT_AMBULATORY_CARE_PROVIDER_SITE_OTHER): Payer: 59

## 2017-10-14 ENCOUNTER — Encounter: Payer: Self-pay | Admitting: Obstetrics and Gynecology

## 2017-10-14 VITALS — BP 125/76 | HR 79 | Wt 175.2 lb

## 2017-10-14 DIAGNOSIS — O09812 Supervision of pregnancy resulting from assisted reproductive technology, second trimester: Secondary | ICD-10-CM | POA: Diagnosis not present

## 2017-10-14 DIAGNOSIS — Z23 Encounter for immunization: Secondary | ICD-10-CM | POA: Diagnosis not present

## 2017-10-14 DIAGNOSIS — Z3402 Encounter for supervision of normal first pregnancy, second trimester: Secondary | ICD-10-CM

## 2017-10-14 DIAGNOSIS — Z3401 Encounter for supervision of normal first pregnancy, first trimester: Secondary | ICD-10-CM

## 2017-10-14 LAB — POCT URINALYSIS DIPSTICK
Bilirubin, UA: NEGATIVE
Blood, UA: NEGATIVE
Glucose, UA: NEGATIVE
KETONES UA: NEGATIVE
LEUKOCYTES UA: NEGATIVE
Nitrite, UA: NEGATIVE
PROTEIN UA: NEGATIVE
Spec Grav, UA: <=1.005 — AB (ref 1.010–1.025)
UROBILINOGEN UA: 0.2 U/dL
pH, UA: 6 (ref 5.0–8.0)

## 2017-10-14 NOTE — Progress Notes (Signed)
ROB: Patient without complaints.  Patient not doing blood sugars-discussed this in detail will do it for the next 3 weeks 4 times a day.  Continues to use metformin as directed.  MaterniT 21 and AFP done today.  Ultrasound today reveals no issues-incomplete.  Will repeat in 3 weeks.  Flu shot today.

## 2017-10-17 LAB — AFP, SERUM, OPEN SPINA BIFIDA
AFP MoM: 1.35
AFP Value: 54.1 ng/mL
GEST. AGE ON COLLECTION DATE: 18.3 wk
Maternal Age At EDD: 29.5 yr
OSBR Risk 1 IN: 4151
TEST RESULTS AFP: NEGATIVE
WEIGHT: 175 [lb_av]

## 2017-10-18 LAB — MATERNIT 21 PLUS CORE, BLOOD
Chromosome 13: NEGATIVE
Chromosome 18: NEGATIVE
Chromosome 21: NEGATIVE
Y CHROMOSOME: DETECTED

## 2017-10-20 ENCOUNTER — Encounter: Payer: Self-pay | Admitting: Obstetrics and Gynecology

## 2017-10-26 ENCOUNTER — Other Ambulatory Visit: Payer: Self-pay | Admitting: Obstetrics and Gynecology

## 2017-10-26 DIAGNOSIS — Z0489 Encounter for examination and observation for other specified reasons: Secondary | ICD-10-CM

## 2017-10-26 DIAGNOSIS — IMO0002 Reserved for concepts with insufficient information to code with codable children: Secondary | ICD-10-CM

## 2017-11-04 ENCOUNTER — Other Ambulatory Visit: Payer: Self-pay | Admitting: Obstetrics and Gynecology

## 2017-11-04 ENCOUNTER — Encounter: Payer: Self-pay | Admitting: Obstetrics and Gynecology

## 2017-11-04 ENCOUNTER — Ambulatory Visit (INDEPENDENT_AMBULATORY_CARE_PROVIDER_SITE_OTHER): Payer: 59 | Admitting: Obstetrics and Gynecology

## 2017-11-04 ENCOUNTER — Ambulatory Visit (INDEPENDENT_AMBULATORY_CARE_PROVIDER_SITE_OTHER): Payer: 59

## 2017-11-04 VITALS — BP 118/73 | HR 64 | Wt 176.1 lb

## 2017-11-04 DIAGNOSIS — O43122 Velamentous insertion of umbilical cord, second trimester: Secondary | ICD-10-CM

## 2017-11-04 DIAGNOSIS — Q270 Congenital absence and hypoplasia of umbilical artery: Secondary | ICD-10-CM

## 2017-11-04 DIAGNOSIS — Z0489 Encounter for examination and observation for other specified reasons: Secondary | ICD-10-CM | POA: Diagnosis not present

## 2017-11-04 DIAGNOSIS — IMO0002 Reserved for concepts with insufficient information to code with codable children: Secondary | ICD-10-CM

## 2017-11-04 DIAGNOSIS — Z3402 Encounter for supervision of normal first pregnancy, second trimester: Secondary | ICD-10-CM

## 2017-11-04 DIAGNOSIS — O24312 Unspecified pre-existing diabetes mellitus in pregnancy, second trimester: Secondary | ICD-10-CM

## 2017-11-04 LAB — POCT URINALYSIS DIPSTICK
Bilirubin, UA: NEGATIVE
Glucose, UA: 500
KETONES UA: NEGATIVE
LEUKOCYTES UA: NEGATIVE
NITRITE UA: NEGATIVE
PROTEIN UA: NEGATIVE
RBC UA: NEGATIVE
UROBILINOGEN UA: 0.2 U/dL
pH, UA: 5 (ref 5.0–8.0)

## 2017-11-04 NOTE — Progress Notes (Signed)
ROB: No complaints.  Sugars are reviewed very carefully patient doing a good job of tracking them.  Fastings mostly okay but 2-hour postprandials especially dinner are elevated.  Patient currently using Metformin 500 3 times daily have increased to 500 a.m. and p.m. and 1000 mid day.  We have discussed should this fail would consider switching to glyburide and should that fail will require insulin. Patient with two-vessel cord and velamentous cord insertion-referred to [facility] for management.  Patient already scheduled for third trimester fetal surveillance because of her diabetes.

## 2017-11-05 ENCOUNTER — Other Ambulatory Visit: Payer: 59

## 2017-11-10 ENCOUNTER — Encounter: Payer: Self-pay | Admitting: Obstetrics and Gynecology

## 2017-11-11 ENCOUNTER — Other Ambulatory Visit: Payer: Self-pay

## 2017-11-11 ENCOUNTER — Encounter: Payer: Self-pay | Admitting: Obstetrics and Gynecology

## 2017-11-11 DIAGNOSIS — E119 Type 2 diabetes mellitus without complications: Secondary | ICD-10-CM

## 2017-11-11 MED ORDER — GLUCOSE BLOOD VI STRP: ORAL_STRIP | 12 refills | Status: DC

## 2017-11-11 MED ORDER — METFORMIN HCL 500 MG PO TABS: 500.0000 mg | ORAL_TABLET | Freq: Four times a day (QID) | ORAL | 2 refills | Status: DC

## 2017-11-11 MED ORDER — FREESTYLE LANCETS MISC: 12 refills | Status: DC

## 2017-11-16 NOTE — L&D Delivery Note (Signed)
Delivery Note   [name] is a 32 y.o. G1P0101 at [redacted]w[redacted]d Estimated Date of Delivery: 03/15/18  PRE-OPERATIVE DIAGNOSIS:  1) [redacted]w[redacted]d pregnancy.   POST-OPERATIVE DIAGNOSIS:  1) [redacted]w[redacted]d pregnancy s/p Vaginal, Vacuum (Extractor)   Delivery Type: Vaginal, Vacuum (Extractor)    Delivery Anesthesia: Stadol  Labor Complications:   PIH.  Category 2 fetal monitor    ESTIMATED BLOOD LOSS: 1200  ml    FINDINGS:   1) female infant, Apgar scores of 4    at 1 minute and 6    at 5 minutes and a birthweight of 98.77  ounces.    2) Nuchal cord: Yes - tight nuchal cord  SPECIMENS:   PLACENTA:   Appearance:      Removal: Expressed      Disposition:     DISPOSITION:  Infant to transported to Neonatal ICU  NARRATIVE SUMMARY: Labor course:  [name] is a G1P0101 at [redacted]w[redacted]d who presented for Pre-eclampsia and non-reassuring fetal status.  She received steroids 3 days ago.  Because of her worsening HTN and category 2 fetal monitor strip I decided induction of labor was necessary.  She underwent cytotec induction.  AROM.  She progressed well in labor . There was occasion small amounts of fetal variability, but no 15x15 accels.  Rare decels occurred. Pt did not push well and made slow progress.  Her expulsive effort was poor.  The fetal vertex was noted on the perineum and was visible at the introitus with pushing.  After discussing vacuum delivery with the patient and answering all questions a vacuum delivery was deemed indicated.  Vacuum was carefully applied and over the next several contractions traction was employed.  Significant progress was made with each contraction.  When the head was crowning the vacuum popped off and was left off.  Careful examination of the perineum was performed and she had a very small introitus.  Local infiltration with lidocaine was performed and a midline episiotomy was cut -  this allowed delivery of the fetal vertex in a controlled manner with the next  push.  There was a very tight nuchal cord which was identified and carefully reduced.  The remainder of the baby delivered without incident the cord was doubly clamped and cut. The infant was taken to the warmer by the neonatal staff who were present. The placenta did not deliver and minimal traction could only be employed because of the velamentous cord insertion.  Eventually two thirds of the placenta delivered with one third still being densely attached.  Using ring forceps the remaining placenta was carefully teased from the cervix and uterus.  The placenta appeared to be intact.  A cord pH was obtained. Patient had some uterine atony and had more than usual postpartum bleeding.  This complicated identification of vaginal tears and examination of the episiotomy.  A rectal examination was performed and a very small anal verge tear was noted.  This was carefully repaired with a 3 layer closure making sure that no sutures went through and through the rectum.  The rectal sphincter was carefully individually repaired with several interrupted sutures.  The remainder of the episiotomy was easily repaired in the usual manner using local anesthesia.  With careful examination it was noted that the entire vagina was edematous and had become denuded in multiple areas.  These areas were all oozing creating a significant amount of vaginal bleeding.  After repositioning the patient obtaining the correct retractors and getting  wall suction with careful examination I was able to identify some individual areas that could be repaired.  The tissue was very friable and several places would not hold suture.  In addition there were several areas that were denuded and were unable to even be oversewn.  At this time it was decided to pack the vagina with vaginal packing in attempt to tamponade all of the denuded areas.  This was performed using saline soaked gauze on a roll.  After packing the vagina hemostasis was noted. A Foley  catheter was placed in the bladder clear urine was found. We have continue to monitor the blood loss of the patient and it has been scant since delivery. Elonda Huskyavid J. Evans, M.D. 02/01/2018 9:42 PM

## 2017-11-23 ENCOUNTER — Telehealth: Payer: Self-pay | Admitting: Obstetrics and Gynecology

## 2017-11-23 NOTE — Telephone Encounter (Signed)
The patient called and stated that she would like for her nurse to call her to determine if her appointment is pushed out to far. Please advise.

## 2017-11-24 ENCOUNTER — Telehealth: Payer: Self-pay

## 2017-11-24 NOTE — Telephone Encounter (Signed)
Spoke with pt- she has a scheduled appointment on 12/03/17.

## 2017-11-25 ENCOUNTER — Encounter: Payer: 59 | Admitting: Obstetrics and Gynecology

## 2017-11-29 ENCOUNTER — Ambulatory Visit
Admission: RE | Admit: 2017-11-29 | Discharge: 2017-11-29 | Disposition: A | Discharge status: Home / Self Care | Payer: 59 | Source: Ambulatory Visit | Attending: Obstetrics & Gynecology | Admitting: Obstetrics & Gynecology

## 2017-11-29 ENCOUNTER — Ambulatory Visit: Payer: 59

## 2017-11-29 ENCOUNTER — Encounter: Payer: 59 | Admitting: Obstetrics and Gynecology

## 2017-11-29 DIAGNOSIS — O43122 Velamentous insertion of umbilical cord, second trimester: Secondary | ICD-10-CM

## 2017-11-29 DIAGNOSIS — Z7984 Long term (current) use of oral hypoglycemic drugs: Secondary | ICD-10-CM | POA: Insufficient documentation

## 2017-11-29 DIAGNOSIS — Q270 Congenital absence and hypoplasia of umbilical artery: Secondary | ICD-10-CM

## 2017-11-29 DIAGNOSIS — Z3A24 24 weeks gestation of pregnancy: Secondary | ICD-10-CM | POA: Diagnosis not present

## 2017-11-29 DIAGNOSIS — O24312 Unspecified pre-existing diabetes mellitus in pregnancy, second trimester: Secondary | ICD-10-CM | POA: Insufficient documentation

## 2017-11-29 DIAGNOSIS — O09812 Supervision of pregnancy resulting from assisted reproductive technology, second trimester: Secondary | ICD-10-CM | POA: Insufficient documentation

## 2017-11-29 DIAGNOSIS — O24319 Unspecified pre-existing diabetes mellitus in pregnancy, unspecified trimester: Secondary | ICD-10-CM

## 2017-11-29 NOTE — Progress Notes (Signed)
[facility] 1[facility].  Patient is aware of date and time.

## 2017-12-03 ENCOUNTER — Encounter: Payer: Self-pay | Admitting: Obstetrics and Gynecology

## 2017-12-03 ENCOUNTER — Ambulatory Visit (INDEPENDENT_AMBULATORY_CARE_PROVIDER_SITE_OTHER): Payer: 59 | Admitting: Obstetrics and Gynecology

## 2017-12-03 VITALS — BP 121/66 | HR 77 | Wt 180.3 lb

## 2017-12-03 DIAGNOSIS — Z3403 Encounter for supervision of normal first pregnancy, third trimester: Secondary | ICD-10-CM

## 2017-12-03 DIAGNOSIS — O43122 Velamentous insertion of umbilical cord, second trimester: Secondary | ICD-10-CM

## 2017-12-03 DIAGNOSIS — O24312 Unspecified pre-existing diabetes mellitus in pregnancy, second trimester: Secondary | ICD-10-CM

## 2017-12-03 LAB — POCT URINALYSIS DIPSTICK
BILIRUBIN UA: NEGATIVE
GLUCOSE UA: NEGATIVE
Leukocytes, UA: NEGATIVE
Nitrite, UA: NEGATIVE
Protein, UA: NEGATIVE
RBC UA: NEGATIVE
SPEC GRAV UA: 1.015 (ref 1.010–1.025)
Urobilinogen, UA: 0.2 E.U./dL
pH, UA: 6.5 (ref 5.0–8.0)

## 2017-12-03 NOTE — Progress Notes (Signed)
ROB: Sugars good on Metformin 4 times a day.  Changing to twice a day for a while.  Had a [facility].  No velamentous cord but two-vessel cord present.  They recommend third trimester fetal surveillance with serial growth scans.  Have discussed this with them and they would like to do the growth scans here.  Fetal echo scheduled for February.

## 2017-12-13 ENCOUNTER — Encounter: Payer: Self-pay | Admitting: Obstetrics and Gynecology

## 2017-12-17 ENCOUNTER — Encounter: Payer: Self-pay | Admitting: Obstetrics and Gynecology

## 2017-12-17 ENCOUNTER — Ambulatory Visit (INDEPENDENT_AMBULATORY_CARE_PROVIDER_SITE_OTHER): Payer: 59 | Admitting: Obstetrics and Gynecology

## 2017-12-17 ENCOUNTER — Other Ambulatory Visit: Payer: Self-pay | Admitting: Obstetrics and Gynecology

## 2017-12-17 VITALS — BP 132/82 | HR 92 | Wt 181.9 lb

## 2017-12-17 DIAGNOSIS — O24313 Unspecified pre-existing diabetes mellitus in pregnancy, third trimester: Secondary | ICD-10-CM

## 2017-12-17 DIAGNOSIS — O09813 Supervision of pregnancy resulting from assisted reproductive technology, third trimester: Secondary | ICD-10-CM

## 2017-12-17 DIAGNOSIS — Z23 Encounter for immunization: Secondary | ICD-10-CM | POA: Diagnosis not present

## 2017-12-17 DIAGNOSIS — Q270 Congenital absence and hypoplasia of umbilical artery: Secondary | ICD-10-CM

## 2017-12-17 DIAGNOSIS — Z130 Encounter for screening for diseases of the blood and blood-forming organs and certain disorders involving the immune mechanism: Secondary | ICD-10-CM

## 2017-12-17 LAB — POCT URINALYSIS DIPSTICK
Bilirubin, UA: NEGATIVE
Blood, UA: NEGATIVE
KETONES UA: NEGATIVE
Leukocytes, UA: NEGATIVE
Nitrite, UA: NEGATIVE
Odor: NEGATIVE
Protein, UA: NEGATIVE
Urobilinogen, UA: 0.2 E.U./dL
pH, UA: 6.5 (ref 5.0–8.0)

## 2017-12-17 MED ORDER — GLYBURIDE 5 MG PO TABS: 5.0000 mg | ORAL_TABLET | Freq: Two times a day (BID) | ORAL | 2 refills | Status: DC

## 2017-12-17 NOTE — Progress Notes (Signed)
ROB: There having doubts regarding their echocardiogram.  We have discussed this in detail and they still have not made a decision.  Glucose monitoring reveals sugars higher than desired.  Will try glyburide 5 mg twice daily and stop Glucophage -  if patient fails glyburide consider insulin.  Growth ultrasound scheduled for February 14.  Plan begin NSTs at 32 weeks.  Blood work today.

## 2017-12-17 NOTE — Progress Notes (Signed)
ROB- tdap and BTC- done. Fetal echo scheduled for  12/24/2017. Pt c/o of leg cramps at nite.

## 2017-12-17 NOTE — Patient Instructions (Signed)
Tdap Vaccine (Tetanus, Diphtheria and Pertussis): What You Need to Know 1. Why get vaccinated? Tetanus, diphtheria and pertussis are very serious diseases. Tdap vaccine can protect us from these diseases. And, Tdap vaccine given to pregnant women can protect newborn babies against pertussis. TETANUS (Lockjaw) is rare in the [place] today. It causes painful muscle tightening and stiffness, usually all over the body.  It can lead to tightening of muscles in the head and neck so you can't open your mouth, swallow, or sometimes even breathe. Tetanus kills about 1 out of 10 people who are infected even after receiving the best medical care.  DIPHTHERIA is also rare in the [place] today. It can cause a thick coating to form in the back of the throat.  It can lead to breathing problems, heart failure, paralysis, and death.  PERTUSSIS (Whooping Cough) causes severe coughing spells, which can cause difficulty breathing, vomiting and disturbed sleep.  It can also lead to weight loss, incontinence, and rib fractures. Up to 2 in 100 adolescents and 5 in 100 adults with pertussis are hospitalized or have complications, which could include pneumonia or death.  These diseases are caused by bacteria. Diphtheria and pertussis are spread from person to person through secretions from coughing or sneezing. Tetanus enters the body through cuts, scratches, or wounds. Before vaccines, as many as 200,000 cases of diphtheria, 200,000 cases of pertussis, and hundreds of cases of tetanus, were reported in the [place] each year. Since vaccination began, reports of cases for tetanus and diphtheria have dropped by about 99% and for pertussis by about 80%. 2. Tdap vaccine Tdap vaccine can protect adolescents and adults from tetanus, diphtheria, and pertussis. One dose of Tdap is routinely given at age 11 or 12. People who did not get Tdap at that age should get it as soon as possible. Tdap is especially  important for healthcare professionals and anyone having close contact with a baby younger than 12 months. Pregnant women should get a dose of Tdap during every pregnancy, to protect the newborn from pertussis. Infants are most at risk for severe, life-threatening complications from pertussis. Another vaccine, called Td, protects against tetanus and diphtheria, but not pertussis. A Td booster should be given every 10 years. Tdap may be given as one of these boosters if you have never gotten Tdap before. Tdap may also be given after a severe cut or burn to prevent tetanus infection. Your doctor or the person giving you the vaccine can give you more information. Tdap may safely be given at the same time as other vaccines. 3. Some people should not get this vaccine  A person who has ever had a life-threatening allergic reaction after a previous dose of any diphtheria, tetanus or pertussis containing vaccine, OR has a severe allergy to any part of this vaccine, should not get Tdap vaccine. Tell the person giving the vaccine about any severe allergies.  Anyone who had coma or long repeated seizures within 7 days after a childhood dose of DTP or DTaP, or a previous dose of Tdap, should not get Tdap, unless a cause other than the vaccine was found. They can still get Td.  Talk to your doctor if you: ? have seizures or another nervous system problem, ? had severe pain or swelling after any vaccine containing diphtheria, tetanus or pertussis, ? ever had a condition called Guillain-Barr Syndrome (GBS), ? aren't feeling well on the day the shot is scheduled. 4. Risks With any medicine, including   vaccines, there is a chance of side effects. These are usually mild and go away on their own. Serious reactions are also possible but are rare. Most people who get Tdap vaccine do not have any problems with it. Mild problems following Tdap: (Did not interfere with activities)  Pain where the shot was given (about  3 in 4 adolescents or 2 in 3 adults)  Redness or swelling where the shot was given (about 1 person in 5)  Mild fever of at least 100.4F (up to about 1 in 25 adolescents or 1 in 100 adults)  Headache (about 3 or 4 people in 10)  Tiredness (about 1 person in 3 or 4)  Nausea, vomiting, diarrhea, stomach ache (up to 1 in 4 adolescents or 1 in 10 adults)  Chills, sore joints (about 1 person in 10)  Body aches (about 1 person in 3 or 4)  Rash, swollen glands (uncommon)  Moderate problems following Tdap: (Interfered with activities, but did not require medical attention)  Pain where the shot was given (up to 1 in 5 or 6)  Redness or swelling where the shot was given (up to about 1 in 16 adolescents or 1 in 12 adults)  Fever over 102F (about 1 in 100 adolescents or 1 in 250 adults)  Headache (about 1 in 7 adolescents or 1 in 10 adults)  Nausea, vomiting, diarrhea, stomach ache (up to 1 or 3 people in 100)  Swelling of the entire arm where the shot was given (up to about 1 in 500).  Severe problems following Tdap: (Unable to perform usual activities; required medical attention)  Swelling, severe pain, bleeding and redness in the arm where the shot was given (rare).  Problems that could happen after any vaccine:  People sometimes faint after a medical procedure, including vaccination. Sitting or lying down for about 15 minutes can help prevent fainting, and injuries caused by a fall. Tell your doctor if you feel dizzy, or have vision changes or ringing in the ears.  Some people get severe pain in the shoulder and have difficulty moving the arm where a shot was given. This happens very rarely.  Any medication can cause a severe allergic reaction. Such reactions from a vaccine are very rare, estimated at fewer than 1 in a million doses, and would happen within a few minutes to a few hours after the vaccination. As with any medicine, there is a very remote chance of a vaccine  causing a serious injury or death. The safety of vaccines is always being monitored. For more information, visit: www.cdc.gov/vaccinesafety/ 5. What if there is a serious problem? What should I look for? Look for anything that concerns you, such as signs of a severe allergic reaction, very high fever, or unusual behavior. Signs of a severe allergic reaction can include hives, swelling of the face and throat, difficulty breathing, a fast heartbeat, dizziness, and weakness. These would usually start a few minutes to a few hours after the vaccination. What should I do?  If you think it is a severe allergic reaction or other emergency that can't wait, call 9-1-1 or get the person to the nearest hospital. Otherwise, call your doctor.  Afterward, the reaction should be reported to the Vaccine Adverse Event Reporting System (VAERS). Your doctor might file this report, or you can do it yourself through the VAERS web site at www.vaers.hhs.gov, or by calling 1-800-822-7967. ? VAERS does not give medical advice. 6. The National Vaccine Injury Compensation Program The National   Vaccine Injury Compensation Program (VICP) is a federal program that was created to compensate people who may have been injured by certain vaccines. Persons who believe they may have been injured by a vaccine can learn about the program and about filing a claim by calling 1-800-338-2382 or visiting the VICP website at www.hrsa.gov/vaccinecompensation. There is a time limit to file a claim for compensation. 7. How can I learn more?  Ask your doctor. He or she can give you the vaccine package insert or suggest other sources of information.  Call your local or state health department.  Contact the Centers for Disease Control and Prevention (CDC): ? Call 1-800-232-4636 (1-800-CDC-INFO) or ? Visit CDC's website at www.cdc.gov/vaccines CDC Tdap Vaccine VIS (01/09/14) This information is not intended to replace advice given to you by your  health care provider. Make sure you discuss any questions you have with your health care provider. Document Released: 05/03/2012 Document Revised: 07/23/2016 Document Reviewed: 07/23/2016 Elsevier Interactive Patient Education  2017 [employer]  

## 2017-12-18 LAB — CBC
HEMOGLOBIN: 12 g/dL (ref 11.1–15.9)
Hematocrit: 36.5 % (ref 34.0–46.6)
MCH: 29.8 pg (ref 26.6–33.0)
MCHC: 32.9 g/dL (ref 31.5–35.7)
MCV: 91 fL (ref 79–97)
PLATELETS: 258 10*3/uL (ref 150–379)
RBC: 4.03 x10E6/uL (ref 3.77–5.28)
RDW: 14.1 % (ref 12.3–15.4)
WBC: 10.6 10*3/uL (ref 3.4–10.8)

## 2017-12-18 LAB — RPR: RPR: NONREACTIVE

## 2017-12-23 ENCOUNTER — Ambulatory Visit (INDEPENDENT_AMBULATORY_CARE_PROVIDER_SITE_OTHER): Payer: 59 | Admitting: Obstetrics and Gynecology

## 2017-12-23 VITALS — BP 119/81 | HR 96 | Wt 182.0 lb

## 2017-12-23 DIAGNOSIS — O24313 Unspecified pre-existing diabetes mellitus in pregnancy, third trimester: Secondary | ICD-10-CM

## 2017-12-23 DIAGNOSIS — Z3493 Encounter for supervision of normal pregnancy, unspecified, third trimester: Secondary | ICD-10-CM

## 2017-12-23 LAB — POCT URINALYSIS DIPSTICK
BILIRUBIN UA: NEGATIVE
Blood, UA: NEGATIVE
KETONES UA: NEGATIVE
Leukocytes, UA: NEGATIVE
Nitrite, UA: NEGATIVE
Protein, UA: NEGATIVE
SPEC GRAV UA: 1.015 (ref 1.010–1.025)
UROBILINOGEN UA: 0.2 U/dL
pH, UA: 6 (ref 5.0–8.0)

## 2017-12-23 NOTE — Progress Notes (Signed)
ROB- pt is doing well, having some constipation- would like something to help her bowels move

## 2017-12-23 NOTE — Progress Notes (Signed)
ROB: Patient switched to glyburide last visit-sugars improved significantly.  Continue glyburide twice daily.  Patient complains of constipation-discussed use of fiber or laxatives in detail.  Patient will try these.  They have decided not to have the fetal echocardiogram. Follow-up ultrasound scheduled here and will begin NSTs at 32 weeks.

## 2017-12-30 ENCOUNTER — Ambulatory Visit (INDEPENDENT_AMBULATORY_CARE_PROVIDER_SITE_OTHER): Payer: 59

## 2017-12-30 ENCOUNTER — Ambulatory Visit: Payer: 59

## 2017-12-30 DIAGNOSIS — Q270 Congenital absence and hypoplasia of umbilical artery: Secondary | ICD-10-CM | POA: Diagnosis not present

## 2018-01-07 ENCOUNTER — Ambulatory Visit (INDEPENDENT_AMBULATORY_CARE_PROVIDER_SITE_OTHER): Payer: 59 | Admitting: Obstetrics and Gynecology

## 2018-01-07 ENCOUNTER — Other Ambulatory Visit: Payer: Self-pay | Admitting: Obstetrics and Gynecology

## 2018-01-07 VITALS — BP 126/78 | HR 91 | Wt 189.3 lb

## 2018-01-07 DIAGNOSIS — O0993 Supervision of high risk pregnancy, unspecified, third trimester: Secondary | ICD-10-CM

## 2018-01-07 DIAGNOSIS — O24113 Pre-existing diabetes mellitus, type 2, in pregnancy, third trimester: Secondary | ICD-10-CM

## 2018-01-07 LAB — POCT URINALYSIS DIPSTICK
BILIRUBIN UA: NEGATIVE
Glucose, UA: NEGATIVE
Ketones, UA: NEGATIVE
LEUKOCYTES UA: NEGATIVE
NITRITE UA: NEGATIVE
PH UA: 6 (ref 5.0–8.0)
PROTEIN UA: NEGATIVE
RBC UA: NEGATIVE
Spec Grav, UA: 1.01 (ref 1.010–1.025)
UROBILINOGEN UA: 0.2 U/dL

## 2018-01-07 NOTE — Progress Notes (Signed)
ROB is having legs and hand cramps during sleeping. Pt's husband stated that she as been snoring more than usually.

## 2018-01-07 NOTE — Patient Instructions (Signed)
Increase Glyburide to 1.5 tablets in a.m., and continue 1 tablet in p.m.

## 2018-01-07 NOTE — Progress Notes (Signed)
ROB: Patient complains of hand swelling.  Also noting more leg cramps and hand cramps when sleeping. Advised on increasing calcium and milk intake, warm baths, massages prior to bed.  Patient has had 7 lb weight gain since last visit. BPs normal. Denies other symptoms of PIH. Continue to monitor closely. Blood sugars reviewed, fastings wnl, occasional postprandials 140s-150s.  Will increase a.m. Glyburide dose to 7.5 mg, and continue 5 mg evening dose. For antenatal testing at 32 weeks.

## 2018-01-14 ENCOUNTER — Ambulatory Visit (INDEPENDENT_AMBULATORY_CARE_PROVIDER_SITE_OTHER): Payer: 59

## 2018-01-14 ENCOUNTER — Other Ambulatory Visit: Payer: 59

## 2018-01-14 DIAGNOSIS — O24113 Pre-existing diabetes mellitus, type 2, in pregnancy, third trimester: Secondary | ICD-10-CM | POA: Diagnosis not present

## 2018-01-17 ENCOUNTER — Other Ambulatory Visit: Payer: 59

## 2018-01-21 ENCOUNTER — Ambulatory Visit (INDEPENDENT_AMBULATORY_CARE_PROVIDER_SITE_OTHER): Payer: 59 | Admitting: Obstetrics and Gynecology

## 2018-01-21 ENCOUNTER — Other Ambulatory Visit: Payer: 59

## 2018-01-21 VITALS — BP 136/84 | HR 77 | Wt 195.5 lb

## 2018-01-21 DIAGNOSIS — O24113 Pre-existing diabetes mellitus, type 2, in pregnancy, third trimester: Secondary | ICD-10-CM

## 2018-01-21 DIAGNOSIS — O0993 Supervision of high risk pregnancy, unspecified, third trimester: Secondary | ICD-10-CM | POA: Diagnosis not present

## 2018-01-21 DIAGNOSIS — R600 Localized edema: Secondary | ICD-10-CM

## 2018-01-21 LAB — POCT URINALYSIS DIPSTICK
BILIRUBIN UA: NEGATIVE
Glucose, UA: NEGATIVE
KETONES UA: NEGATIVE
Leukocytes, UA: NEGATIVE
NITRITE UA: NEGATIVE
PH UA: 6 (ref 5.0–8.0)
RBC UA: NEGATIVE
Spec Grav, UA: 1.02 (ref 1.010–1.025)
UROBILINOGEN UA: 0.2 U/dL

## 2018-01-21 NOTE — Progress Notes (Signed)
ROB pt has really swollen feet noticed after last visit on 01/07/18 and it started getting worst. Hands are weak with lost of strength.

## 2018-01-21 NOTE — Progress Notes (Signed)
ROB

## 2018-01-21 NOTE — Progress Notes (Signed)
ROB: Patient complains of swelling of lower extremities and weakness in hands.  Exam notes +2 pitting edema bilaterally in feet, some swelling in hands and abdomen.  BPs wnl today, will continue to monitor. Advised on ted hose, elevating legs when resting.  Also advised on hand brace prn. Glucose log noted improved fastings after Glyburide dose increase, 2 elevated dinner postprandials (however patient reports she did eat rice, and celebrated her wedding anniversary so ate some things she should not have). Will follow up at next visit.  NST performed today was reviewed and was found to be reactive.  Continue recommended antenatal testing and prenatal care. Continue twice weekly NSTs, for growth scan in 4 weeks.    NONSTRESS TEST INTERPRETATION  INDICATIONS: Gestational DM, single umbilical artery.  FHR baseline: 134 RESULTS:Reactive COMMENTS: No contractions   PLAN: 1. Continue fetal kick counts twice a day. 2. Continue antepartum testing as scheduled-Biweekly

## 2018-01-25 ENCOUNTER — Other Ambulatory Visit: Payer: 59

## 2018-01-25 ENCOUNTER — Ambulatory Visit (INDEPENDENT_AMBULATORY_CARE_PROVIDER_SITE_OTHER): Payer: 59 | Admitting: Obstetrics and Gynecology

## 2018-01-25 ENCOUNTER — Encounter: Payer: Self-pay | Admitting: Obstetrics and Gynecology

## 2018-01-25 VITALS — BP 145/90 | HR 88 | Wt 199.5 lb

## 2018-01-25 DIAGNOSIS — Z3403 Encounter for supervision of normal first pregnancy, third trimester: Secondary | ICD-10-CM

## 2018-01-25 DIAGNOSIS — O09813 Supervision of pregnancy resulting from assisted reproductive technology, third trimester: Secondary | ICD-10-CM

## 2018-01-25 DIAGNOSIS — O24113 Pre-existing diabetes mellitus, type 2, in pregnancy, third trimester: Secondary | ICD-10-CM

## 2018-01-25 DIAGNOSIS — E669 Obesity, unspecified: Secondary | ICD-10-CM

## 2018-01-25 DIAGNOSIS — O163 Unspecified maternal hypertension, third trimester: Secondary | ICD-10-CM

## 2018-01-25 LAB — POCT URINALYSIS DIPSTICK
Bilirubin, UA: NEGATIVE
Blood, UA: NEGATIVE
Glucose, UA: NEGATIVE
KETONES UA: NEGATIVE
Leukocytes, UA: NEGATIVE
NITRITE UA: NEGATIVE
PROTEIN UA: 300
Urobilinogen, UA: 0.2 E.U./dL
pH, UA: 8 (ref 5.0–8.0)

## 2018-01-25 NOTE — Progress Notes (Signed)
ROB: Doing well, no complaints.  NST performed today was reviewed and was found to be reactive.  Patient with initial mildly elevated BP on NST, remainder were wnl. Continue to monitor and order PIH labs if continues to be elevated. Continue recommended antenatal testing and prenatal care.  Glucose levels improved, however patient not logging as frequently as she should. Urged better compliance. Continue Glyburide as prescribed.   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     PLAN: 1. Continue fetal kick counts twice a day. 2. Continue antepartum testing as scheduled-Biweekly

## 2018-01-26 ENCOUNTER — Other Ambulatory Visit: Payer: Self-pay | Admitting: Obstetrics and Gynecology

## 2018-01-26 DIAGNOSIS — O24313 Unspecified pre-existing diabetes mellitus in pregnancy, third trimester: Secondary | ICD-10-CM

## 2018-01-27 ENCOUNTER — Other Ambulatory Visit: Payer: Self-pay | Admitting: Obstetrics and Gynecology

## 2018-01-27 DIAGNOSIS — O24313 Unspecified pre-existing diabetes mellitus in pregnancy, third trimester: Secondary | ICD-10-CM

## 2018-01-27 LAB — URINE CULTURE

## 2018-01-27 MED ORDER — GLYBURIDE 5 MG PO TABS: 5.0000 mg | ORAL_TABLET | Freq: Every evening | ORAL | 2 refills | Status: DC

## 2018-01-27 MED ORDER — GLYBURIDE 5 MG PO TABS: 7.5000 mg | ORAL_TABLET | Freq: Every day | ORAL | 2 refills | Status: DC

## 2018-01-27 NOTE — Telephone Encounter (Signed)
Pt was called and informed that her medication was sent the pharmacy as prescribed.

## 2018-01-28 ENCOUNTER — Telehealth: Payer: Self-pay | Admitting: Certified Nurse Midwife

## 2018-01-28 ENCOUNTER — Observation Stay (HOSPITAL_BASED_OUTPATIENT_CLINIC_OR_DEPARTMENT_OTHER)
Admission: RE | Admit: 2018-01-28 | Discharge: 2018-01-28 | Disposition: A | Discharge status: Home / Self Care | Payer: 59 | Source: Home / Self Care | Attending: Obstetrics and Gynecology | Admitting: Obstetrics and Gynecology

## 2018-01-28 ENCOUNTER — Other Ambulatory Visit: Payer: 59

## 2018-01-28 ENCOUNTER — Ambulatory Visit (INDEPENDENT_AMBULATORY_CARE_PROVIDER_SITE_OTHER): Payer: 59 | Admitting: Certified Nurse Midwife

## 2018-01-28 ENCOUNTER — Encounter: Payer: 59 | Admitting: Certified Nurse Midwife

## 2018-01-28 VITALS — BP 130/91 | HR 63 | Wt 201.5 lb

## 2018-01-28 DIAGNOSIS — O09813 Supervision of pregnancy resulting from assisted reproductive technology, third trimester: Secondary | ICD-10-CM | POA: Insufficient documentation

## 2018-01-28 DIAGNOSIS — Z3A33 33 weeks gestation of pregnancy: Secondary | ICD-10-CM | POA: Diagnosis not present

## 2018-01-28 DIAGNOSIS — O169 Unspecified maternal hypertension, unspecified trimester: Secondary | ICD-10-CM

## 2018-01-28 DIAGNOSIS — Z3403 Encounter for supervision of normal first pregnancy, third trimester: Secondary | ICD-10-CM

## 2018-01-28 DIAGNOSIS — O1493 Unspecified pre-eclampsia, third trimester: Secondary | ICD-10-CM | POA: Diagnosis not present

## 2018-01-28 DIAGNOSIS — O1210 Gestational proteinuria, unspecified trimester: Secondary | ICD-10-CM | POA: Insufficient documentation

## 2018-01-28 LAB — CBC WITH DIFFERENTIAL/PLATELET
BASOS ABS: 0 10*3/uL (ref 0.0–0.2)
BASOS: 0 %
EOS (ABSOLUTE): 0.1 10*3/uL (ref 0.0–0.4)
Eos: 1 %
HEMOGLOBIN: 13.4 g/dL (ref 11.1–15.9)
Hematocrit: 40.4 % (ref 34.0–46.6)
IMMATURE GRANS (ABS): 0 10*3/uL (ref 0.0–0.1)
Immature Granulocytes: 1 %
LYMPHS ABS: 2.9 10*3/uL (ref 0.7–3.1)
LYMPHS: 33 %
MCH: 30.8 pg (ref 26.6–33.0)
MCHC: 33.2 g/dL (ref 31.5–35.7)
MCV: 93 fL (ref 79–97)
Monocytes Absolute: 0.5 10*3/uL (ref 0.1–0.9)
Monocytes: 5 %
NEUTROS ABS: 5.3 10*3/uL (ref 1.4–7.0)
Neutrophils: 60 %
PLATELETS: 181 10*3/uL (ref 150–379)
RBC: 4.35 x10E6/uL (ref 3.77–5.28)
RDW: 14.1 % (ref 12.3–15.4)
WBC: 8.8 10*3/uL (ref 3.4–10.8)

## 2018-01-28 LAB — COMPREHENSIVE METABOLIC PANEL
ALBUMIN: 3.1 g/dL — AB (ref 3.5–5.5)
ALT: 18 IU/L (ref 0–32)
AST: 21 IU/L (ref 0–40)
Albumin/Globulin Ratio: 1.1 — ABNORMAL LOW (ref 1.2–2.2)
Alkaline Phosphatase: 115 IU/L (ref 39–117)
BUN / CREAT RATIO: 11 (ref 9–23)
BUN: 7 mg/dL (ref 6–20)
Bilirubin Total: <0.2 mg/dL (ref 0.0–1.2)
CALCIUM: 9.4 mg/dL (ref 8.7–10.2)
CO2: 21 mmol/L (ref 20–29)
CREATININE: 0.62 mg/dL (ref 0.57–1.00)
Chloride: 102 mmol/L (ref 96–106)
GFR calc Af Amer: 141 mL/min/{1.73_m2} (ref >59)
GFR, EST NON AFRICAN AMERICAN: 122 mL/min/{1.73_m2} (ref >59)
GLOBULIN, TOTAL: 2.7 g/dL (ref 1.5–4.5)
Glucose: 130 mg/dL — ABNORMAL HIGH (ref 65–99)
Potassium: 4.9 mmol/L (ref 3.5–5.2)
SODIUM: 135 mmol/L (ref 134–144)
TOTAL PROTEIN: 5.8 g/dL — AB (ref 6.0–8.5)

## 2018-01-28 LAB — PROTEIN / CREATININE RATIO, URINE
Creatinine, Urine: 22.8 mg/dL
Protein, Ur: 177.6 mg/dL
Protein/Creat Ratio: 7789 mg/g creat — ABNORMAL HIGH (ref 0–200)

## 2018-01-28 LAB — URIC ACID: URIC ACID: 5.6 mg/dL (ref 2.5–7.1)

## 2018-01-28 LAB — POCT URINALYSIS DIPSTICK
BILIRUBIN UA: NEGATIVE
GLUCOSE UA: NEGATIVE
Ketones, UA: NEGATIVE
Leukocytes, UA: NEGATIVE
Nitrite, UA: NEGATIVE
RBC UA: NEGATIVE
Spec Grav, UA: 1.015 (ref 1.010–1.025)
Urobilinogen, UA: 0.2 E.U./dL
pH, UA: 6 (ref 5.0–8.0)

## 2018-01-28 MED ORDER — BETAMETHASONE SOD PHOS & ACET 6 (3-3) MG/ML IJ SUSP
INTRAMUSCULAR | Status: AC
  Administered 2018-01-28: 12 mg via INTRAMUSCULAR
  Filled 2018-01-28: qty 1

## 2018-01-28 MED ORDER — BETAMETHASONE SOD PHOS & ACET 6 (3-3) MG/ML IJ SUSP
12.0000 mg | INTRAMUSCULAR | Status: DC
  Administered 2018-01-28: 12 mg via INTRAMUSCULAR

## 2018-01-28 NOTE — Progress Notes (Signed)
NONSTRESS TEST INTERPRETATION  INDICATIONS: Gestational DM, single umbilical artery, IVF-donor egg FHR baseline: 130 RESULTS:Reactive COMMENTS: No contractions   PLAN: 1. Continue fetal kick counts twice a day. 2. Continue antepartum testing as scheduled-Biweekly 3. Urine dip and blood pressure results discussed with Dr. Greggory KeeneFrancesco. Will obtain STAT PIH labs and plan for betamethasone injections x 2. Patient would like betamethasone after lab results are reviewed.  4. Reviewed red flag symptoms and when to call.    Gunnar BullaJenkins Michelle Diezel Mazur, CNM [facility], [facility]

## 2018-01-28 NOTE — Telephone Encounter (Signed)
Telephone call to patient. Verified full name and date of birth.   Labs results reviewed and education provided regarding pre-eclampsia and plan of care. Advised betamethasone injection today and tomorrow with NST and blood pressure check.   Patient and spouse would like to speak to Dr. Greggory KeeneFrancesco prior to betamethasone injections to discuss risk and benefits of medication.   Dr. Greggory KeeneFrancesco notified and verbalized understanding. Patient will present with spouse to birthing suites this evening at 1700 for discussion with Dr. Greggory KeeneFrancesco.    Gunnar BullaJenkins Michelle Lawhorn, CNM [facility], [facility]

## 2018-01-28 NOTE — H&P (Signed)
L&D OB Triage Note  SUBJECTIVE [name] is a 32 y.o. G1P0000 female at 6339w3d, EDD Estimated Date of Delivery: 03/15/18 who presented to triage for steroids for fetal lung maturity due to the diagnosis of preeclampsia.  Patient was seen in the office earlier and was diagnosed with preeclampsia. Patient reports no visual changes, scotomata, headache, blurred vision, right upper quadrant pain, epigastric pain, nausea with or without emesis.  Fetal movement is good. BPP earlier today in the office was 8/8; AFI 10 cm; estimated fetal weight 50th percentile-verbal report   OBJECTIVE MD Evaluation: LMP 09/04/2016 Blood pressure: 156/108, 158/99 Pleasant female in no acute distress. Extremities: 3+ edema DTRs: 1+; no clonus CBC Latest Ref Rng & Units 01/28/2018 12/17/2017 08/20/2017  WBC 3.4 - 10.8 x10E3/uL 8.8 10.6 10.1  Hemoglobin 11.1 - 15.9 g/dL 16.113.4 09.612.0 04.511.9  Hematocrit 34.0 - 46.6 % 40.4 36.5 35.8  Platelets 150 - 379 x10E3/uL 181 258 313   CMP Latest Ref Rng & Units 01/28/2018 08/20/2017 11/26/2016  Glucose 65 - 99 mg/dL 409(W130(H) 119(J165(H) 98  BUN 6 - 20 mg/dL 7 - 13  Creatinine 4.780.57 - 1.00 mg/dL 2.950.62 - 6.210.77  Sodium 308134 - 144 mmol/L 135 - 141  Potassium 3.5 - 5.2 mmol/L 4.9 - 4.6  Chloride 96 - 106 mmol/L 102 - 100  CO2 20 - 29 mmol/L 21 - 27  Calcium 8.7 - 10.2 mg/dL 9.4 - 9.7  Total Protein 6.0 - 8.5 g/dL 6.5(H5.8(L) - -  Total Bilirubin 0.0 - 1.2 mg/dL <8.4<0.2 - -  Alkaline Phos 39 - 117 IU/L 115 - -  AST 0 - 40 IU/L 21 - -  ALT 0 - 32 IU/L 18 - -   Urine PC ratio: 7789 Uric acid 5.6  ASSESSMENT Impression:  1. Pregnancy:  G1P0000 at 7139w3d , EDD Estimated Date of Delivery: 03/15/18 2.  Preeclampsia; markedly elevated urine PC ratio, hemoconcentration; other labs normal 3.  Donor egg pregnancy  PLAN 1.  BP check 2.  Betamethasone 12.5 mg IM x1 3.  Return in 24 hours for second betamethasone injection, NST, and BP check 4.  Preeclampsia precautions-extensive discussion with  patient and husband over 30 minutes regarding what to expect with clinical course of preeclampsia, management, and clinical outcomes. 5.  Neonatology consult-today regarding newborn outcome at 34 weeks if delivered.  Herold HarmsMartin A Ashkan Chamberland, MD

## 2018-01-28 NOTE — Patient Instructions (Signed)
Preeclampsia and Eclampsia °Preeclampsia is a serious condition that develops only during pregnancy. It is also called toxemia of pregnancy. This condition causes high blood pressure along with other symptoms, such as swelling and headaches. These symptoms may develop as the condition gets worse. Preeclampsia may occur at 20 weeks of pregnancy or later. °Diagnosing and treating preeclampsia early is very important. If not treated early, it can cause serious problems for you and your baby. One problem it can lead to is eclampsia, which is a condition that causes muscle jerking or shaking (convulsions or seizures) in the mother. Delivering your baby is the best treatment for preeclampsia or eclampsia. Preeclampsia and eclampsia symptoms usually go away after your baby is born. °What are the causes? °The cause of preeclampsia is not known. °What increases the risk? °The following risk factors make you more likely to develop preeclampsia: °· Being pregnant for the first time. °· Having had preeclampsia during a past pregnancy. °· Having a family history of preeclampsia. °· Having high blood pressure. °· Being pregnant with twins or triplets. °· Being 35 or older. °· Being African-American. °· Having kidney disease or diabetes. °· Having medical conditions such as lupus or blood diseases. °· Being very overweight (obese). ° °What are the signs or symptoms? °The earliest signs of preeclampsia are: °· High blood pressure. °· Increased protein in your urine. Your health care provider will check for this at every visit before you give birth (prenatal visit). ° °Other symptoms that may develop as the condition gets worse include: °· Severe headaches. °· Sudden weight gain. °· Swelling of the hands, face, legs, and feet. °· Nausea and vomiting. °· Vision problems, such as blurred or double vision. °· Numbness in the face, arms, legs, and feet. °· Urinating less than usual. °· Dizziness. °· Slurred speech. °· Abdominal pain,  especially upper abdominal pain. °· Convulsions or seizures. ° °Symptoms generally go away after giving birth. °How is this diagnosed? °There are no screening tests for preeclampsia. Your health care provider will ask you about symptoms and check for signs of preeclampsia during your prenatal visits. You may also have tests that include: °· Urine tests. °· Blood tests. °· Checking your blood pressure. °· Monitoring your baby’s heart rate. °· Ultrasound. ° °How is this treated? °You and your health care provider will determine the treatment approach that is best for you. Treatment may include: °· Having more frequent prenatal exams to check for signs of preeclampsia, if you have an increased risk for preeclampsia. °· Bed rest. °· Reducing how much salt (sodium) you eat. °· Medicine to lower your blood pressure. °· Staying in the hospital, if your condition is severe. There, treatment will focus on controlling your blood pressure and the amount of fluids in your body (fluid retention). °· You may need to take medicine (magnesium sulfate) to prevent seizures. This medicine may be given as an injection or through an IV tube. °· Delivering your baby early, if your condition gets worse. You may have your labor started with medicine (induced), or you may have a cesarean delivery. ° °Follow these instructions at home: °Eating and drinking ° °· Drink enough fluid to keep your urine clear or pale yellow. °· Eat a healthy diet that is low in sodium. Do not add salt to your food. Check nutrition labels to see how much sodium a food or beverage contains. °· Avoid caffeine. °Lifestyle °· Do not use any products that contain nicotine or tobacco, such as cigarettes   and e-cigarettes. If you need help quitting, ask your health care provider. °· Do not use alcohol or drugs. °· Avoid stress as much as possible. Rest and get plenty of sleep. °General instructions °· Take over-the-counter and prescription medicines only as told by your  health care provider. °· When lying down, lie on your side. This keeps pressure off of your baby. °· When sitting or lying down, raise (elevate) your feet. Try putting some pillows underneath your lower legs. °· Exercise regularly. Ask your health care provider what kinds of exercise are best for you. °· Keep all follow-up and prenatal visits as told by your health care provider. This is important. °How is this prevented? °To prevent preeclampsia or eclampsia from developing during another pregnancy: °· Get proper medical care during pregnancy. Your health care provider may be able to prevent preeclampsia or diagnose and treat it early. °· Your health care provider may have you take a low-dose aspirin or a calcium supplement during your next pregnancy. °· You may have tests of your blood pressure and kidney function after giving birth. °· Maintain a healthy weight. Ask your health care provider for help managing weight gain during pregnancy. °· Work with your health care provider to manage any long-term (chronic) health conditions you have, such as diabetes or kidney problems. ° °Contact a health care provider if: °· You gain more weight than expected. °· You have headaches. °· You have nausea or vomiting. °· You have abdominal pain. °· You feel dizzy or light-headed. °Get help right away if: °· You develop sudden or severe swelling anywhere in your body. This usually happens in the legs. °· You gain 5 lbs (2.3 kg) or more during one week. °· You have severe: °? Abdominal pain. °? Headaches. °? Dizziness. °? Vision problems. °? Confusion. °? Nausea or vomiting. °· You have a seizure. °· You have trouble moving any part of your body. °· You develop numbness in any part of your body. °· You have trouble speaking. °· You have any abnormal bleeding. °· You pass out. °This information is not intended to replace advice given to you by your health care provider. Make sure you discuss any questions you have with your health  care provider. °Document Released: 10/30/2000 Document Revised: 06/30/2016 Document Reviewed: 06/08/2016 °Elsevier Interactive Patient Education © 2018 [employer] ° °Fetal Movement Counts °Patient Name: ________________________________________________ Patient Due Date: ____________________ °What is a fetal movement count? °A fetal movement count is the number of times that you feel your baby move during a certain amount of time. This may also be called a fetal kick count. A fetal movement count is recommended for every pregnant woman. You may be asked to start counting fetal movements as early as week 28 of your pregnancy. °Pay attention to when your baby is most active. You may notice your baby's sleep and wake cycles. You may also notice things that make your baby move more. You should do a fetal movement count: °· When your baby is normally most active. °· At the same time each day. ° °A good time to count movements is while you are resting, after having something to eat and drink. °How do I count fetal movements? °1. Find a quiet, comfortable area. Sit, or lie down on your side. °2. Write down the date, the start time and stop time, and the number of movements that you felt between those two times. Take this information with you to your health care visits. °3. For   2 hours, count kicks, flutters, swishes, rolls, and jabs. You should feel at least 10 movements during 2 hours. °4. You may stop counting after you have felt 10 movements. °5. If you do not feel 10 movements in 2 hours, have something to eat and drink. Then, keep resting and counting for 1 hour. If you feel at least 4 movements during that hour, you may stop counting. °Contact a health care provider if: °· You feel fewer than 4 movements in 2 hours. °· Your baby is not moving like he or she usually does. °Date: ____________ Start time: ____________ Stop time: ____________ Movements: ____________ °Date: ____________ Start time: ____________ Stop  time: ____________ Movements: ____________ °Date: ____________ Start time: ____________ Stop time: ____________ Movements: ____________ °Date: ____________ Start time: ____________ Stop time: ____________ Movements: ____________ °Date: ____________ Start time: ____________ Stop time: ____________ Movements: ____________ °Date: ____________ Start time: ____________ Stop time: ____________ Movements: ____________ °Date: ____________ Start time: ____________ Stop time: ____________ Movements: ____________ °Date: ____________ Start time: ____________ Stop time: ____________ Movements: ____________ °Date: ____________ Start time: ____________ Stop time: ____________ Movements: ____________ °This information is not intended to replace advice given to you by your health care provider. Make sure you discuss any questions you have with your health care provider. °Document Released: 12/02/2006 Document Revised: 07/01/2016 Document Reviewed: 12/12/2015 °Elsevier Interactive Patient Education © 2018 [employer] ° °

## 2018-01-29 ENCOUNTER — Other Ambulatory Visit: Payer: Self-pay

## 2018-01-29 ENCOUNTER — Observation Stay
Admission: RE | Admit: 2018-01-29 | Discharge: 2018-01-29 | Disposition: A | Discharge status: Home / Self Care | Payer: 59 | Attending: Obstetrics and Gynecology | Admitting: Obstetrics and Gynecology

## 2018-01-29 ENCOUNTER — Encounter: Payer: Self-pay | Admitting: *Deleted

## 2018-01-29 DIAGNOSIS — Z3A33 33 weeks gestation of pregnancy: Secondary | ICD-10-CM

## 2018-01-29 DIAGNOSIS — O1493 Unspecified pre-eclampsia, third trimester: Secondary | ICD-10-CM | POA: Diagnosis not present

## 2018-01-29 DIAGNOSIS — Z3490 Encounter for supervision of normal pregnancy, unspecified, unspecified trimester: Secondary | ICD-10-CM

## 2018-01-29 LAB — PROTEIN / CREATININE RATIO, URINE
CREATININE, URINE: 68 mg/dL
PROTEIN CREATININE RATIO: 6.74 mg/mg{creat} — AB (ref 0.00–0.15)
Total Protein, Urine: 458 mg/dL

## 2018-01-29 MED ORDER — BETAMETHASONE SOD PHOS & ACET 6 (3-3) MG/ML IJ SUSP
12.0000 mg | Freq: Once | INTRAMUSCULAR | Status: AC
  Administered 2018-01-29: 12 mg via INTRAMUSCULAR

## 2018-01-29 MED ORDER — BETAMETHASONE SOD PHOS & ACET 6 (3-3) MG/ML IJ SUSP
INTRAMUSCULAR | Status: AC
  Administered 2018-01-29: 12 mg via INTRAMUSCULAR
  Filled 2018-01-29: qty 1

## 2018-01-29 NOTE — OB Triage Note (Signed)
Here for NST, urine PC ratio, and second dose of betamethasone. Elaina HoopsElks, [name]

## 2018-01-29 NOTE — OB Triage Note (Signed)
Discharge instructions provided and reviewed.  Follow up care discussed.  Pt verbalizes understanding. 

## 2018-01-30 NOTE — Discharge Summary (Signed)
L&D OB Triage Note  SUBJECTIVE [name] is a 32 y.o. G1P0000 female at 8474w5d, EDD Estimated Date of Delivery: 03/15/18 who presented to triage second betamethasone injection and preeclampsia monitoring. Patient denies preeclampsia symptoms including headache, blurred vision, scotomata, right upper quadrant pain, epigastric discomfort, nausea or vomiting.  Fetus is active.   OBJECTIVE Nursing Evaluation: BP (!) 159/98   Pulse 89   Temp 98.2 F (36.8 C) (Oral)   Resp 18   Ht 5\' 3"  (1.6 m)   Wt 201 lb (91.2 kg)   LMP 09/04/2016   BMI 35.61 kg/m  Urine PC ratio: 6740 (01/29/2018) Urine PC ratio: 7789 (01/28/2018)  NST was performed and has been reviewed by me.  NST INTERPRETATION: Indications: Preeclampsia  Mode: External(d/c'd for discharge per MD order) Baseline Rate (A): 125 bpm Variability: Moderate Accelerations: 10 x 10 Decelerations: None     Contraction Frequency (min): occasional(denies contractions)  ASSESSMENT Impression:  1. Pregnancy:  G1P0000 at 8074w5d , EDD Estimated Date of Delivery: 03/15/18 2.  reactive NST 3.  Preeclampsia  PLAN 1. Reassurance given; preeclampsia symptoms are not not present today; urine PC ratio is stable; NST is reactive; blood pressures are not in the severe range requiring treatment 2. Second betamethasone injection is given today for lung maturity 3.  Return in 48 hours for NST and BP check in the office 4.  Preeclampsia precautions are given   Herold HarmsMartin A Evelen Vazguez, MD

## 2018-01-31 ENCOUNTER — Ambulatory Visit (INDEPENDENT_AMBULATORY_CARE_PROVIDER_SITE_OTHER): Payer: 59 | Admitting: Obstetrics and Gynecology

## 2018-01-31 ENCOUNTER — Inpatient Hospital Stay
Admission: RE | Admit: 2018-01-31 | Discharge: 2018-02-04 | DRG: 768 | Disposition: A | Discharge status: Home / Self Care | Payer: 59 | Source: Ambulatory Visit | Attending: Obstetrics and Gynecology | Admitting: Obstetrics and Gynecology

## 2018-01-31 ENCOUNTER — Other Ambulatory Visit: Payer: 59

## 2018-01-31 ENCOUNTER — Other Ambulatory Visit: Payer: Self-pay

## 2018-01-31 ENCOUNTER — Observation Stay: Payer: 59

## 2018-01-31 ENCOUNTER — Encounter: Payer: Self-pay | Admitting: Obstetrics and Gynecology

## 2018-01-31 VITALS — BP 150/90 | Wt 203.7 lb

## 2018-01-31 DIAGNOSIS — Z3A34 34 weeks gestation of pregnancy: Secondary | ICD-10-CM | POA: Diagnosis not present

## 2018-01-31 DIAGNOSIS — O2412 Pre-existing diabetes mellitus, type 2, in childbirth: Secondary | ICD-10-CM | POA: Diagnosis present

## 2018-01-31 DIAGNOSIS — O24113 Pre-existing diabetes mellitus, type 2, in pregnancy, third trimester: Secondary | ICD-10-CM

## 2018-01-31 DIAGNOSIS — O9081 Anemia of the puerperium: Secondary | ICD-10-CM | POA: Diagnosis not present

## 2018-01-31 DIAGNOSIS — D62 Acute posthemorrhagic anemia: Secondary | ICD-10-CM | POA: Diagnosis not present

## 2018-01-31 DIAGNOSIS — Z7984 Long term (current) use of oral hypoglycemic drugs: Secondary | ICD-10-CM | POA: Diagnosis not present

## 2018-01-31 DIAGNOSIS — E119 Type 2 diabetes mellitus without complications: Secondary | ICD-10-CM | POA: Diagnosis present

## 2018-01-31 DIAGNOSIS — O169 Unspecified maternal hypertension, unspecified trimester: Secondary | ICD-10-CM

## 2018-01-31 DIAGNOSIS — O134 Gestational [pregnancy-induced] hypertension without significant proteinuria, complicating childbirth: Secondary | ICD-10-CM | POA: Diagnosis present

## 2018-01-31 DIAGNOSIS — O43123 Velamentous insertion of umbilical cord, third trimester: Secondary | ICD-10-CM | POA: Diagnosis present

## 2018-01-31 DIAGNOSIS — Z3403 Encounter for supervision of normal first pregnancy, third trimester: Secondary | ICD-10-CM

## 2018-01-31 DIAGNOSIS — O1494 Unspecified pre-eclampsia, complicating childbirth: Secondary | ICD-10-CM | POA: Diagnosis present

## 2018-01-31 DIAGNOSIS — O288 Other abnormal findings on antenatal screening of mother: Secondary | ICD-10-CM

## 2018-01-31 DIAGNOSIS — Z3A37 37 weeks gestation of pregnancy: Secondary | ICD-10-CM | POA: Diagnosis not present

## 2018-01-31 DIAGNOSIS — O43893 Other placental disorders, third trimester: Secondary | ICD-10-CM | POA: Diagnosis present

## 2018-01-31 DIAGNOSIS — O139 Gestational [pregnancy-induced] hypertension without significant proteinuria, unspecified trimester: Secondary | ICD-10-CM | POA: Diagnosis present

## 2018-01-31 LAB — COMPREHENSIVE METABOLIC PANEL
ALBUMIN: 2.5 g/dL — AB (ref 3.5–5.0)
ALK PHOS: 89 U/L (ref 38–126)
ALT: 27 U/L (ref 14–54)
ANION GAP: 9 (ref 5–15)
AST: 36 U/L (ref 15–41)
BILIRUBIN TOTAL: 0.3 mg/dL (ref 0.3–1.2)
BUN: 12 mg/dL (ref 6–20)
CALCIUM: 8.6 mg/dL — AB (ref 8.9–10.3)
CO2: 20 mmol/L — AB (ref 22–32)
CREATININE: 0.75 mg/dL (ref 0.44–1.00)
Chloride: 102 mmol/L (ref 101–111)
GFR calc Af Amer: >60 mL/min (ref >60)
GFR calc non Af Amer: >60 mL/min (ref >60)
GLUCOSE: 143 mg/dL — AB (ref 65–99)
Potassium: 4.4 mmol/L (ref 3.5–5.1)
SODIUM: 131 mmol/L — AB (ref 135–145)
TOTAL PROTEIN: 5.7 g/dL — AB (ref 6.5–8.1)

## 2018-01-31 LAB — POCT URINALYSIS DIPSTICK
BILIRUBIN UA: NEGATIVE
Glucose, UA: NEGATIVE
Ketones, UA: NEGATIVE
LEUKOCYTES UA: NEGATIVE
Nitrite, UA: NEGATIVE
ODOR: NEGATIVE
Spec Grav, UA: 1.01 (ref 1.010–1.025)
Urobilinogen, UA: 0.2 E.U./dL
pH, UA: 6 (ref 5.0–8.0)

## 2018-01-31 LAB — CBC WITH DIFFERENTIAL/PLATELET
BASOS PCT: 0 %
Basophils Absolute: 0 10*3/uL (ref 0–0.1)
EOS ABS: 0 10*3/uL (ref 0–0.7)
Eosinophils Relative: 0 %
HEMATOCRIT: 37.7 % (ref 35.0–47.0)
HEMOGLOBIN: 12.6 g/dL (ref 12.0–16.0)
LYMPHS ABS: 2.4 10*3/uL (ref 1.0–3.6)
Lymphocytes Relative: 19 %
MCH: 30.6 pg (ref 26.0–34.0)
MCHC: 33.3 g/dL (ref 32.0–36.0)
MCV: 91.7 fL (ref 80.0–100.0)
MONOS PCT: 7 %
Monocytes Absolute: 0.9 10*3/uL (ref 0.2–0.9)
NEUTROS ABS: 9.6 10*3/uL — AB (ref 1.4–6.5)
NEUTROS PCT: 74 %
Platelets: 170 10*3/uL (ref 150–440)
RBC: 4.11 MIL/uL (ref 3.80–5.20)
RDW: 14 % (ref 11.5–14.5)
WBC: 13 10*3/uL — AB (ref 3.6–11.0)

## 2018-01-31 LAB — BASIC METABOLIC PANEL
ANION GAP: 7 (ref 5–15)
BUN: 11 mg/dL (ref 6–20)
CHLORIDE: 104 mmol/L (ref 101–111)
CO2: 19 mmol/L — AB (ref 22–32)
Calcium: 8.5 mg/dL — ABNORMAL LOW (ref 8.9–10.3)
Creatinine, Ser: 0.71 mg/dL (ref 0.44–1.00)
GFR calc non Af Amer: >60 mL/min (ref >60)
Glucose, Bld: 132 mg/dL — ABNORMAL HIGH (ref 65–99)
Potassium: 4.7 mmol/L (ref 3.5–5.1)
SODIUM: 130 mmol/L — AB (ref 135–145)

## 2018-01-31 MED ORDER — LACTATED RINGERS IV SOLN
500.0000 mL | INTRAVENOUS | Status: DC | PRN
  Administered 2018-02-02: 500 mL via INTRAVENOUS

## 2018-01-31 MED ORDER — SOD CITRATE-CITRIC ACID 500-334 MG/5ML PO SOLN: 30.0000 mL | ORAL | Status: DC | PRN

## 2018-01-31 MED ORDER — ONDANSETRON HCL 4 MG/2ML IJ SOLN: 4.0000 mg | Freq: Four times a day (QID) | INTRAMUSCULAR | Status: DC | PRN

## 2018-01-31 MED ORDER — ACETAMINOPHEN 325 MG PO TABS: 650.0000 mg | ORAL_TABLET | ORAL | Status: DC | PRN

## 2018-01-31 MED ORDER — CLINDAMYCIN PHOSPHATE 900 MG/50ML IV SOLN: 900.0000 mg | Freq: Once | INTRAVENOUS | Status: DC

## 2018-01-31 MED ORDER — LACTATED RINGERS IV SOLN
INTRAVENOUS | Status: DC
  Administered 2018-02-01 (×2): via INTRAVENOUS

## 2018-01-31 MED ORDER — OXYTOCIN 40 UNITS IN LACTATED RINGERS INFUSION - SIMPLE MED
2.5000 [IU]/h | INTRAVENOUS | Status: DC
  Administered 2018-02-01: 2.5 [IU]/h via INTRAVENOUS
  Filled 2018-01-31: qty 1000

## 2018-01-31 MED ORDER — SODIUM CHLORIDE 0.9 % IV SOLN
2.0000 g | Freq: Once | INTRAVENOUS | Status: AC
  Administered 2018-02-01: 2 g via INTRAVENOUS
  Filled 2018-01-31: qty 2000

## 2018-01-31 MED ORDER — LABETALOL HCL 5 MG/ML IV SOLN
20.0000 mg | INTRAVENOUS | Status: AC | PRN
  Administered 2018-02-01: 40 mg via INTRAVENOUS
  Administered 2018-02-01 (×2): 20 mg via INTRAVENOUS
  Filled 2018-01-31 (×2): qty 4
  Filled 2018-01-31: qty 16
  Filled 2018-01-31: qty 8

## 2018-01-31 MED ORDER — LIDOCAINE HCL (PF) 1 % IJ SOLN: 30.0000 mL | INTRAMUSCULAR | Status: DC | PRN

## 2018-01-31 MED ORDER — BUTORPHANOL TARTRATE 1 MG/ML IJ SOLN
1.0000 mg | INTRAMUSCULAR | Status: DC | PRN
  Administered 2018-02-01 (×2): 1 mg via INTRAVENOUS
  Filled 2018-01-31 (×3): qty 1

## 2018-01-31 MED ORDER — OXYTOCIN BOLUS FROM INFUSION
500.0000 mL | Freq: Once | INTRAVENOUS | Status: AC
  Administered 2018-02-01: 500 mL via INTRAVENOUS

## 2018-01-31 NOTE — Progress Notes (Signed)
ROB:  Pt has HTN but not Severe PIH  - no other features.  NST - non-reactive with mild fetal tachycardia.  Baby moving but "not as much today." Pt sent to L&D for further evaluation including CBC and BMP with extended monitoring.

## 2018-01-31 NOTE — Progress Notes (Signed)
NONSTRESS TEST INTERPRETATION  INDICATIONS: PRE eclampsia,GDM  FHR baseline:  RESULTS:Reactive and Non-reactive - no -decels, but fetal tachy 165-170 COMMENTS:    PLAN: 1. Pt sent to L&D for labs and extended monitoring.

## 2018-02-01 ENCOUNTER — Encounter: Payer: 59 | Admitting: Obstetrics and Gynecology

## 2018-02-01 ENCOUNTER — Other Ambulatory Visit: Payer: 59

## 2018-02-01 DIAGNOSIS — O134 Gestational [pregnancy-induced] hypertension without significant proteinuria, complicating childbirth: Secondary | ICD-10-CM

## 2018-02-01 DIAGNOSIS — Z3A37 37 weeks gestation of pregnancy: Secondary | ICD-10-CM

## 2018-02-01 LAB — GLUCOSE, CAPILLARY: Glucose-Capillary: 167 mg/dL — ABNORMAL HIGH (ref 65–99)

## 2018-02-01 MED ORDER — IBUPROFEN 600 MG PO TABS
600.0000 mg | ORAL_TABLET | Freq: Four times a day (QID) | ORAL | Status: DC
  Administered 2018-02-01 – 2018-02-04 (×6): 600 mg via ORAL
  Filled 2018-02-01 (×7): qty 1

## 2018-02-01 MED ORDER — AMMONIA AROMATIC IN INHA
RESPIRATORY_TRACT | Status: AC
  Filled 2018-02-01: qty 10

## 2018-02-01 MED ORDER — OXYTOCIN 10 UNIT/ML IJ SOLN
INTRAMUSCULAR | Status: AC
  Filled 2018-02-01: qty 2

## 2018-02-01 MED ORDER — MISOPROSTOL 50MCG HALF TABLET
ORAL_TABLET | ORAL | Status: AC
  Filled 2018-02-01: qty 1

## 2018-02-01 MED ORDER — LIDOCAINE HCL (PF) 1 % IJ SOLN
INTRAMUSCULAR | Status: AC
  Filled 2018-02-01: qty 30

## 2018-02-01 MED ORDER — MISOPROSTOL 200 MCG PO TABS
ORAL_TABLET | ORAL | Status: AC
  Administered 2018-02-01: 50 ug
  Filled 2018-02-01: qty 4

## 2018-02-01 MED ORDER — MISOPROSTOL 50MCG HALF TABLET
ORAL_TABLET | ORAL | Status: AC
  Administered 2018-02-01: 50 ug via VAGINAL
  Filled 2018-02-01: qty 1

## 2018-02-01 MED ORDER — MISOPROSTOL 50MCG HALF TABLET
50.0000 ug | ORAL_TABLET | ORAL | Status: DC
  Administered 2018-02-01: 50 ug via VAGINAL

## 2018-02-01 MED ORDER — SODIUM CHLORIDE 0.9 % IV SOLN
1.0000 g | Freq: Once | INTRAVENOUS | Status: AC
  Administered 2018-02-01: 1 g via INTRAVENOUS

## 2018-02-01 MED ORDER — OXYTOCIN 40 UNITS IN LACTATED RINGERS INFUSION - SIMPLE MED
INTRAVENOUS | Status: AC
  Filled 2018-02-01: qty 1000

## 2018-02-01 MED ORDER — SODIUM CHLORIDE 0.9 % IV SOLN
INTRAVENOUS | Status: AC
  Administered 2018-02-01: 1 g via INTRAVENOUS
  Filled 2018-02-01: qty 1000

## 2018-02-01 MED ORDER — METHYLERGONOVINE MALEATE 0.2 MG/ML IJ SOLN
INTRAMUSCULAR | Status: AC
  Administered 2018-02-01: 0.2 mg
  Filled 2018-02-01: qty 1

## 2018-02-01 MED ORDER — GLYBURIDE 5 MG PO TABS
5.0000 mg | ORAL_TABLET | Freq: Once | ORAL | Status: AC
  Administered 2018-02-01: 5 mg via ORAL
  Filled 2018-02-01: qty 1

## 2018-02-01 NOTE — Progress Notes (Signed)
Patient ID: Rudean CurtRadha Montes, female   DOB: 05/05/1986, 32 y.o.   MRN: [id number]   @ 7AM Cvx: L/C/A/-3/SOFT EFM: - NON-REACTIVE 50mcg Cytotec given intravaginally

## 2018-02-01 NOTE — Progress Notes (Deleted)
Report given to Angelique Blonderenise, RN with [facility]

## 2018-02-01 NOTE — Plan of Care (Signed)
[facility]  Cytotec IOL per MD.  Cervical change noted.  AROM by MD for augmentation.  Regular contractions.  Category 1 and 2 FHR tracing.  Pain managed so far with 1 mg Stadol IVP.  Antibiotics started for GBS prophylaxis.

## 2018-02-01 NOTE — H&P (Signed)
History and Physical   HPI  [name] is a 32 y.o. 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   OB History  Obstetric History   G1   P0   T0   P0   A0   L0    SAB0   TAB0   Ectopic0   Multiple0   Live Births0     # Outcome Date GA Lbr Len/2nd Weight Sex Delivery Anes PTL Lv  1 Current               PROBLEM LIST  Pregnancy complications or risks: Patient Active Problem List   Diagnosis Date Noted  . Labor and delivery indication for care or intervention 01/31/2018  . PIH (pregnancy induced hypertension) 01/31/2018  . Pregnancy 01/29/2018  . Proteinuria affecting pregnancy, antepartum 01/28/2018  . Elevated blood pressure affecting pregnancy, antepartum 01/28/2018  . Preeclampsia, third trimester 01/28/2018  . 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 Pregnancy resulting from in vitro fertilization 09/17/2017  . Type 2 diabetes mellitus in pregnancy, third trimester 09/17/2017  . Obesity (BMI 30.0-34.9) 08/20/2017  . Type 2 diabetes mellitus without complication, without long-term current use of insulin (HCC) 11/26/2016  . 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 Infertility associated with anovulation 11/26/2016    Prenatal labs and studies: ABO, Rh: --/--/A POS (03/19 0121) Antibody: NEG (03/19 0121) Rubella: 16.20 (10/05 1447) RPR: Non Reactive (02/01 0935)  HBsAg: Negative (10/05 1447)  HIV: Non Reactive (10/05 1447)  GBS:    Past Medical History:  Diagnosis Date  . Diabetes mellitus without complication (HCC)   . Hormone replacement therapy      Past Surgical History:  Procedure Laterality Date  . APPENDECTOMY       Medications    Current Discharge Medication List    CONTINUE these medications which have  NOT CHANGED   Details  DHA-EPA-VITAMIN E PO     glucose blood test strip Use as instructed Qty: 100 each, Refills: 12   Comments: Free style lite test strips    !! glyBURIDE (DIABETA) 5 MG tablet Take 5 mg by mouth daily with breakfast.    !! glyBURIDE (DIABETA) 5 MG tablet Take 1 tablet (5 mg total) by mouth every evening. Qty: 30 tablet, Refills: 2   Associated Diagnoses: Pre-existing diabetes mellitus during pregnancy in third trimester    !! glyBURIDE (DIABETA) 5 MG tablet Take 1.5 tablets (7.5 mg total) by mouth daily with breakfast. Qty: 45 tablet, Refills: 2    Lancets (FREESTYLE) lancets Use as instructed Qty: 100 each, Refills: 12    Prenatal Vit-Fe Fumarate-FA (PRENATAL VITAMIN) 27-0.8 MG TABS     Probiotic Product (PROBIOTIC ADVANCED PO)     psyllium (METAMUCIL) 58.6 % powder Take 1 packet by mouth daily.     !! - Potential duplicate medications found. Please discuss with provider.       Allergies  Patient has no known allergies.  Review of Systems  Pertinent items are noted in HPI.  Physical Exam  BP (!) 152/89   Pulse 81   Temp 98.5 F (36.9 C) (Oral)   Resp 16   Ht 5\' 3"  (1.6 m)   Wt 203 lb (92.1 kg)   LMP 09/04/2016   BMI 35.96 kg/m   Lungs:  CTA B Cardio: RRR without M/R/G Abd:  Soft, gravid, NT Presentation: cephalic EXT: No C/C/ 1+ Edema DTRs: 2+ B CERVIX: C/L/A/-3/soft  See Prenatal records for more detailed PE.   FHR:  Baseline: 162 bpm  Toco: Uterine Contractions: Irregular, patient not feeling them.   Test Results  Results for orders placed or performed during the hospital encounter of 01/31/18 (from the past 24 hour(s))  Basic metabolic panel     Status: Abnormal   Collection Time: 01/31/18  5:11 PM  Result Value Ref Range   Sodium 130 (L) 135 - 145 mmol/L   Potassium 4.7 3.5 - 5.1 mmol/L   Chloride 104 101 - 111 mmol/L   CO2 19 (L) 22 - 32 mmol/L   Glucose, Bld 132 (H) 65 - 99 mg/dL   BUN 11 6 - 20 mg/dL    Creatinine, Ser 1.610.71 0.44 - 1.00 mg/dL   Calcium 8.5 (L) 8.9 - 10.3 mg/dL   GFR calc non Af Amer >60 >60 mL/min   GFR calc Af Amer >60 >60 mL/min   Anion gap 7 5 - 15  CBC with Differential/Platelet     Status: Abnormal   Collection Time: 01/31/18  6:18 PM  Result Value Ref Range   WBC 13.0 (H) 3.6 - 11.0 K/uL   RBC 4.11 3.80 - 5.20 MIL/uL   Hemoglobin 12.6 12.0 - 16.0 g/dL   HCT 09.637.7 04.535.0 - 40.947.0 %   MCV 91.7 80.0 - 100.0 fL   MCH 30.6 26.0 - 34.0 pg   MCHC 33.3 32.0 - 36.0 g/dL   RDW 81.114.0 91.411.5 - 78.214.5 %   Platelets 170 150 - 440 K/uL   Neutrophils Relative % 74 %   Neutro Abs 9.6 (H) 1.4 - 6.5 K/uL   Lymphocytes Relative 19 %   Lymphs Abs 2.4 1.0 - 3.6 K/uL   Monocytes Relative 7 %   Monocytes Absolute 0.9 0.2 - 0.9 K/uL   Eosinophils Relative 0 %   Eosinophils Absolute 0.0 0 - 0.7 K/uL   Basophils Relative 0 %   Basophils Absolute 0.0 0 - 0.1 K/uL  Comprehensive metabolic panel     Status: Abnormal   Collection Time: 01/31/18  6:18 PM  Result Value Ref Range   Sodium 131 (L) 135 - 145 mmol/L   Potassium 4.4 3.5 - 5.1 mmol/L   Chloride 102 101 - 111 mmol/L   CO2 20 (L) 22 - 32 mmol/L   Glucose, Bld 143 (H) 65 - 99 mg/dL   BUN 12 6 - 20 mg/dL   Creatinine, Ser 9.560.75 0.44 - 1.00 mg/dL   Calcium 8.6 (L) 8.9 - 10.3 mg/dL   Total Protein 5.7 (L) 6.5 - 8.1 g/dL   Albumin 2.5 (L) 3.5 - 5.0 g/dL   AST 36 15 - 41 U/L   ALT 27 14 - 54 U/L   Alkaline Phosphatase 89 38 - 126 U/L   Total Bilirubin 0.3 0.3 - 1.2 mg/dL   GFR calc non Af Amer >60 >60 mL/min   GFR calc Af Amer >60 >60 mL/min   Anion gap 9 5 - 15  Glucose, capillary     Status: Abnormal   Collection Time: 02/01/18  1:09 AM  Result Value Ref Range   Glucose-Capillary 167 (H) 65 - 99 mg/dL  Type and screen     Status: None   Collection Time: 02/01/18  1:21 AM  Result Value Ref Range   ABO/RH(D) A POS    Antibody Screen NEG    Sample Expiration  02/04/2018 Performed at [facility], [address], [place], [place] [address]     Assessment   G1P0000 at [redacted]w[redacted]d Estimated Date of Delivery: 03/15/18  The fetus is non-reassuring.   2/8 BPP. Pregnancy-induced hypertension generally 150s over 90s. Placental and cord abnormalities 34 weeks estimated gestational age -patient received betamethasone 3 days ago.   Patient Active Problem List   Diagnosis Date Noted  . Labor and delivery indication for care or intervention 01/31/2018  . PIH (pregnancy induced hypertension) 01/31/2018  . Pregnancy 01/29/2018  . Proteinuria affecting pregnancy, antepartum 01/28/2018  . Elevated blood pressure affecting pregnancy, antepartum 01/28/2018  . Preeclampsia, third trimester 01/28/2018  . 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 Pregnancy resulting from in vitro fertilization 09/17/2017  . Type 2 diabetes mellitus in pregnancy, third trimester 09/17/2017  . Obesity (BMI 30.0-34.9) 08/20/2017  . Type 2 diabetes mellitus without complication, without long-term current use of insulin (HCC) 11/26/2016  . 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 Infertility associated with anovulation 11/26/2016    Plan  1. Admit to L&D :  Induction using cytotec -discussed in detail with the patient.  All questions answered.  Complications of this pregnancy reviewed.  High risk for cesarean delivery discussed in detail. 2. EFM (as noted above) 3. Epidural if desired. Stadol for IV pain until epidural requested. 4. Admission labs  5.  Treat for severe range pressures as needed.  Elonda Husky, M.D. 02/01/2018 10:29 AM

## 2018-02-02 LAB — CBC
HCT: 27 % — ABNORMAL LOW (ref 35.0–47.0)
HEMATOCRIT: 27.6 % — AB (ref 35.0–47.0)
HEMOGLOBIN: 9 g/dL — AB (ref 12.0–16.0)
Hemoglobin: 9 g/dL — ABNORMAL LOW (ref 12.0–16.0)
MCH: 29.4 pg (ref 26.0–34.0)
MCH: 30 pg (ref 26.0–34.0)
MCHC: 32.6 g/dL (ref 32.0–36.0)
MCHC: 33.3 g/dL (ref 32.0–36.0)
MCV: 88.5 fL (ref 80.0–100.0)
MCV: 92.1 fL (ref 80.0–100.0)
Platelets: 188 10*3/uL (ref 150–440)
Platelets: 153 10*3/uL (ref 150–440)
RBC: 3 MIL/uL — AB (ref 3.80–5.20)
RBC: 3.05 MIL/uL — AB (ref 3.80–5.20)
RDW: 14.1 % (ref 11.5–14.5)
RDW: 14.5 % (ref 11.5–14.5)
WBC: 17.9 10*3/uL — AB (ref 3.6–11.0)
WBC: 20.4 10*3/uL — AB (ref 3.6–11.0)

## 2018-02-02 LAB — ABO/RH: ABO/RH(D): A POS

## 2018-02-02 LAB — PREPARE RBC (CROSSMATCH)

## 2018-02-02 LAB — RPR: RPR Ser Ql: NONREACTIVE

## 2018-02-02 LAB — GLUCOSE, CAPILLARY: GLUCOSE-CAPILLARY: 185 mg/dL — AB (ref 65–99)

## 2018-02-02 MED ORDER — ACETAMINOPHEN 325 MG PO TABS: 650.0000 mg | ORAL_TABLET | ORAL | Status: DC | PRN

## 2018-02-02 MED ORDER — TETANUS-DIPHTH-ACELL PERTUSSIS 5-2.5-18.5 LF-MCG/0.5 IM SUSP
0.5000 mL | Freq: Once | INTRAMUSCULAR | Status: DC
  Filled 2018-02-02: qty 0.5

## 2018-02-02 MED ORDER — AMMONIA AROMATIC IN INHA
RESPIRATORY_TRACT | Status: AC
  Filled 2018-02-02: qty 10

## 2018-02-02 MED ORDER — BENZOCAINE-MENTHOL 20-0.5 % EX AERO: 1.0000 "application " | INHALATION_SPRAY | CUTANEOUS | Status: DC | PRN

## 2018-02-02 MED ORDER — GLYBURIDE 5 MG PO TABS
5.0000 mg | ORAL_TABLET | Freq: Once | ORAL | Status: AC
  Administered 2018-02-02: 5 mg via ORAL
  Filled 2018-02-02 (×2): qty 1

## 2018-02-02 MED ORDER — SENNOSIDES-DOCUSATE SODIUM 8.6-50 MG PO TABS
2.0000 | ORAL_TABLET | ORAL | Status: DC
  Administered 2018-02-03 – 2018-02-04 (×2): 2 via ORAL
  Filled 2018-02-02 (×3): qty 2

## 2018-02-02 MED ORDER — SODIUM CHLORIDE 0.9 % IV SOLN: Freq: Once | INTRAVENOUS | Status: DC

## 2018-02-02 MED ORDER — LACTATED RINGERS IV SOLN: INTRAVENOUS | Status: DC

## 2018-02-02 MED ORDER — GLYBURIDE 5 MG PO TABS
5.0000 mg | ORAL_TABLET | Freq: Every day | ORAL | Status: DC
  Administered 2018-02-03 – 2018-02-04 (×2): 5 mg via ORAL
  Filled 2018-02-02 (×2): qty 1

## 2018-02-02 MED ORDER — METHYLERGONOVINE MALEATE 0.2 MG/ML IJ SOLN: 0.2000 mg | INTRAMUSCULAR | Status: DC | PRN

## 2018-02-02 MED ORDER — ZOLPIDEM TARTRATE 5 MG PO TABS: 5.0000 mg | ORAL_TABLET | Freq: Every evening | ORAL | Status: DC | PRN

## 2018-02-02 MED ORDER — PRENATAL MULTIVITAMIN CH
1.0000 | ORAL_TABLET | Freq: Every day | ORAL | Status: DC
  Administered 2018-02-02 – 2018-02-04 (×3): 1 via ORAL
  Filled 2018-02-02 (×3): qty 1

## 2018-02-02 MED ORDER — DOCUSATE SODIUM 100 MG PO CAPS
100.0000 mg | ORAL_CAPSULE | Freq: Two times a day (BID) | ORAL | Status: DC
  Administered 2018-02-02 – 2018-02-04 (×4): 100 mg via ORAL
  Filled 2018-02-02 (×4): qty 1

## 2018-02-02 MED ORDER — OXYCODONE-ACETAMINOPHEN 5-325 MG PO TABS: 1.0000 | ORAL_TABLET | ORAL | Status: DC | PRN

## 2018-02-02 MED ORDER — SIMETHICONE 80 MG PO CHEW: 80.0000 mg | CHEWABLE_TABLET | ORAL | Status: DC | PRN

## 2018-02-02 MED ORDER — DIPHENHYDRAMINE HCL 25 MG PO CAPS: 25.0000 mg | ORAL_CAPSULE | Freq: Four times a day (QID) | ORAL | Status: DC | PRN

## 2018-02-02 MED ORDER — OXYTOCIN 40 UNITS IN LACTATED RINGERS INFUSION - SIMPLE MED
2.5000 [IU]/h | INTRAVENOUS | Status: DC | PRN
  Filled 2018-02-02: qty 1000

## 2018-02-02 MED ORDER — METHYLERGONOVINE MALEATE 0.2 MG PO TABS
0.2000 mg | ORAL_TABLET | ORAL | Status: DC | PRN
  Filled 2018-02-02: qty 1

## 2018-02-02 MED ORDER — ACETAMINOPHEN 325 MG PO TABS
650.0000 mg | ORAL_TABLET | Freq: Once | ORAL | Status: AC
  Administered 2018-02-02: 650 mg via ORAL
  Filled 2018-02-02: qty 2

## 2018-02-02 NOTE — Progress Notes (Signed)
Patient ID: [name], female   DOB: 11/24/1985, 32 y.o.   MRN: [id number]   Progress Note - Vaginal Delivery  -patient seen at 12 noon today.  [name] is a 32 y.o. G1P0101 now PP day 1 s/p Vaginal, Vacuum (Extractor) .   Subjective:  The patient reports no complaints.  She is doing much better this morning after 2 units of blood.  She looks ask and functions more like herself.  She has been out of bed without lightheadedness.  She has been on a full liquid diet without issue.  She has occasional cramping but denies significant pelvic pain. She anticipates breast-feeding and has begun pumping.  Objective:  Vital signs in last 24 hours: Temp:  [97.7 F (36.5 C)-98.4 F (36.9 C)] 98.3 F (36.8 C) (03/20 1339) Pulse Rate:  [70-105] 89 (03/20 1339) Resp:  [16-19] 19 (03/20 1339) BP: (92-193)/(51-116) 137/87 (03/20 1339) SpO2:  [96 %-100 %] 100 % (03/20 0937)  Physical Exam:  General: alert, cooperative and no distress Lochia: appropriate scant bleeding -vaginal packing still in place. Uterine Fundus: firm DVT Evaluation: No evidence of DVT seen on physical exam. Urine is clear with good output noted.    Data Review Recent Labs    02/02/18 0036 02/02/18 0638  HGB 9.0* 9.0*  HCT 27.6* 27.0*    Assessment/Plan: Active Problems:   Labor and delivery indication for care or intervention   PIH (pregnancy induced hypertension) Beginning to diurese.  Hypertension but not close to severe range. Vaginal bleeding much improved-packing still in place. Patient feeling much better and doing much better after receiving 2 units of packed red cells.   -- Continue routine PP care.  Removal of packing tomorrow coincident with removal of Foley catheter.  Patient may begin regular diet today.  Resume glyburide 5 mg p.o. every morning.  Expect continued resolution of PIH including improvement in peripheral edema.   Elonda Huskyavid J. Evans, M.D. 02/02/2018 4:14 PM

## 2018-02-02 NOTE — Progress Notes (Signed)
Dr. Logan BoresEvans at bedside discussing plan of care with patient and family. Discussed plan to keep vaginal packing/ foley cath in over night and remove them in the morning. Patient verbalized understanding and agreement with the plan.   Orders received to restart patients prescription glyburide at 5mg  per day with breakfast and to give a dose now.

## 2018-02-02 NOTE — Progress Notes (Signed)
Evaluation of postpartum bleeding:  Patient with symptomatic anemia lightheaded when sitting on the side of the bed. Postpartum lochia remains greater than normal.  I have examined all of her pads and I do not find her bleeding excessive.  I agree that it is greater than normal but I believe that removal of the packing will only make her bleeding worse. Evaluation of the fundus reveals a firm uterus.  It does not appear to be riding up or increasing in size. (I do not believe that she is feeling her uterus with clot)  Stat CBC reveals a hemoglobin of 9.  Previously 12 at the time of admission.  Because of her symptomatic anemia and suspected large amount of blood loss plan transfusion of 1 unit followed by second unit of packed red cells.  I have discussed  this with the patient.  She has agreed to receive blood.  Pulse remains between 90 and 100 and I feel with much more significant blood loss her pulse would be higher.  Urine output greater than 400 since delivery.  Plan: She has not received any IV hydration postpartum other than KVO I have increased her IV to 200/h after a 500 cc bolus. 2 units packed red cells. Further evaluation of bleeding with packing removal tomorrow.

## 2018-02-03 LAB — CULTURE, BETA STREP (GROUP B ONLY)

## 2018-02-03 LAB — CBC
HCT: 26.8 % — ABNORMAL LOW (ref 35.0–47.0)
HEMOGLOBIN: 8.7 g/dL — AB (ref 12.0–16.0)
MCH: 28.4 pg (ref 26.0–34.0)
MCHC: 32.5 g/dL (ref 32.0–36.0)
MCV: 87.4 fL (ref 80.0–100.0)
Platelets: 155 10*3/uL (ref 150–440)
RBC: 3.07 MIL/uL — AB (ref 3.80–5.20)
RDW: 15.2 % — ABNORMAL HIGH (ref 11.5–14.5)
WBC: 19.8 10*3/uL — ABNORMAL HIGH (ref 3.6–11.0)

## 2018-02-03 NOTE — Lactation Note (Signed)
Lactation Consultation Note  Patient Name: Rudean CurtRadha Mullane ZOXWR'UToday's Date: 02/03/2018 Reason for consult: Follow-up assessment   Maternal Data Has patient been taught Hand Expression?: Yes Does the patient have breastfeeding experience prior to this delivery?: No  Mother has been taught about hand expression and milk storage. Sh has been encouraged to pump every 2 to 3 hours. She is using the intiation setting.  WE discussed that she should use the Symphony pump here and in the NICU .         Interventions    Lactation Tools Discussed/Used WIC Program: No Pump Review: Setup, frequency, and cleaning;Milk Storage;Other (comment)(Jane Morton Video) Date initiated:: 02/03/18   Consult Status      Eber JonesCarolyn P Claretta Kendra 02/03/2018, 2:05 PM

## 2018-02-03 NOTE — Progress Notes (Signed)
Dr. Logan BoresEvans removed pt's vaginal packing and foley.  No s/s of active bleeding after removal.

## 2018-02-03 NOTE — Plan of Care (Signed)
Vs stable; bedrest until Dr. Logan BoresEvans rounds on pt this morning; Dr. Logan BoresEvans to remove the foley catheter and vaginal packing; has declined pain meds all shift; tolerating regular diet; using breast pump about every 3-4 hours

## 2018-02-03 NOTE — Progress Notes (Signed)
Patient ID: [name], female   DOB: 02/02/1986, 32 y.o.   MRN: [id number] Progress Note - Vaginal Delivery  [name] is a 32 y.o. G1P0101 now PP day 2 s/p Vaginal, Vacuum (Extractor) .   Subjective: She feels well today.  She reports no lightheadedness when out of bed.  She is drinking without issue and taking limited p.o.  (She is afraid of having a bowel movements as she is limiting her intake).  She is having occasional cramping but no significant pain. Last night she felt pelvic pressure and felt that she needed to have a bowel movement and at the time passed a large clot from the vagina part of her packing also came out with the clot.  Since that time her bleeding has been scant-consistent with normal postpartum lochia. She reports no other headaches or visual changes.  Objective:  Vital signs in last 24 hours: Temp:  [98.1 F (36.7 C)-100 F (37.8 C)] 98.2 F (36.8 C) (03/21 0809) Pulse Rate:  [89-108] 95 (03/21 0809) Resp:  [18-20] 20 (03/21 0809) BP: (111-137)/(76-92) 130/79 (03/21 0809) SpO2:  [97 %-100 %] 99 % (03/21 0809)  Physical Exam:  General: alert, cooperative and no distress Lochia: appropriate Uterine Fundus: firm DVT Evaluation: No evidence of DVT seen on physical exam.  Patient examined and remainder of the packing removed.  The packing was at the introitus and there was no packing further up in the vagina.  Maybe only 1 foot of packing remaining.  The Foley catheter was removed at this time.  Clear urine was noted. Her urine output has been excellent. DTRs as reported by nursing +2 and lower extremities without clonus.    Data Review Recent Labs    02/02/18 0638 02/03/18 0542  HGB 9.0* 8.7*  HCT 27.0* 26.8*    Assessment/Plan: 1.  Pregnancy-induced hypertension-preeclampsia  Patient diuresing well.  Blood pressures significantly improved.  No secondary signs.  Normal reflexes. 2.  Postpartum hemorrhage/denuded vagina.  Packing has  been out since last night with normal postpartum lochia.  No excessive bleeding.  H&H stable. 3.  Anal verge tear  I have encouraged the patient to eat a regular diet and she is receiving stool softeners.  She has been restricting her diet purposefully against medical advice and we have discussed this in some detail. 4.  [facility].  Father of the baby says the baby is doing well and has not had seizures there but he has limited information at this time.  We have discussed the possibility of transfer and they have decided that they will stay here for the remainder of her care.   -- Continue routine PP care. --Out of bed to chair and walking in hall --Follow vaginal bleeding and urine output.   Elonda Huskyavid J. Carsen Leaf, M.D. 02/03/2018 10:02 AM

## 2018-02-04 ENCOUNTER — Other Ambulatory Visit: Payer: 59

## 2018-02-04 ENCOUNTER — Encounter: Payer: 59 | Admitting: Obstetrics and Gynecology

## 2018-02-04 LAB — BPAM RBC
BLOOD PRODUCT EXPIRATION DATE: 201904072359
BLOOD PRODUCT EXPIRATION DATE: 201904072359
Blood Product Expiration Date: 201904072359
ISSUE DATE / TIME: 201903200805
ISSUE DATE / TIME: 201903200224
UNIT TYPE AND RH: 6200
Unit Type and Rh: 6200
Unit Type and Rh: 6200

## 2018-02-04 LAB — SURGICAL PATHOLOGY

## 2018-02-04 LAB — TYPE AND SCREEN
ABO/RH(D): A POS
Antibody Screen: NEGATIVE
UNIT DIVISION: 0
Unit division: 0
Unit division: 0

## 2018-02-04 LAB — PREPARE RBC (CROSSMATCH)

## 2018-02-04 MED ORDER — COCONUT OIL OIL
1.0000 "application " | TOPICAL_OIL | Status: DC | PRN
  Administered 2018-02-04: 1 via TOPICAL
  Filled 2018-02-04: qty 120

## 2018-02-04 MED ORDER — ESTROGENS, CONJUGATED 0.625 MG/GM VA CREA: TOPICAL_CREAM | VAGINAL | 1 refills | Status: DC

## 2018-02-04 NOTE — Progress Notes (Signed)
Pt discharged.  Discharge instructions, prescriptions and follow up appointment given to and reviewed with pt. Pt verbalized understanding. Escorted out by auxillary. 

## 2018-02-04 NOTE — Discharge Summary (Signed)
Discharge Summary  Date of Admission: 01/31/2018  Date of Discharge: 02/04/2018  Admitting Diagnosis: Induction of labor at 4466w0d for non-reasuring FHR and 2/8 BPP with pre-eclampsia  Mode of Delivery: vacuum-assisted vaginal delivery                 Discharge Diagnosis: Same with PPH, anal verge tear, multiple vaginal tears   Intrapartum Procedures: Atificial rupture of membranes, episiotomy 2nd, GBS prophylaxis and laceration 4th - anal verge tear   Post partum procedures: blood transfusion  Summary: She was originally seen for worsening hypertension and proteinuria.  She had hypertension without severe levels of blood pressure and no severe features.  She was given 2 days of steroids and an NST was performed along with blood pressure checks.  Her NST was reactive with good fetal movement and her blood pressure remained elevated but not in the severe range.  She was discharged home from the hospital and scheduled to come back to the office for further evaluation with the expectation that once she reached [redacted] weeks gestation induction of labor would be recommended.Marland Kitchen.  Upon her return to the office (33/6) I saw her and found her to have a nonreactive NST.  She reported significant decrease in fetal movement.  Her blood pressure remained elevated but not severe.  I sent her to the hospital for extended fetal monitoring.  No accelerations were noted and I ordered a biophysical profile.  This returned 2 out of 8.  She was monitored through the night and the next morning at [redacted] weeks gestation induction of labor was begun.  During her labor she was noted to have some severe range pressures which were treated with labetalol.  She progressed rapidly in labor without an epidural.  The monitor strip remained category 2 but no repetitive late decelerations or prolonged decelerations were noted.  During the second stage of labor she pushed for approximately 1 hour with the fetal  vertex visible at the introitus and then her maternal expulsive effort rapidly declined.  A vacuum delivery was discussed with the parents and they agreed.  Because the head was so low the vacuum was easily applied to the vertex and over the next 2 contractions traction was employed.  The vacuum popped off when the head was crowning.  An episiotomy was deemed necessary and performed.  This allowed delivery of the fetal vertex.  A tight nuchal cord was noted.  And with some manipulation this was reduced allowing delivery of the fetal shoulders and body.  Immediately postpartum, there was a partially retained placenta (dictation).  Uterine atony was also noted and treated.  Careful examination of the vagina revealed a grossly edematous epithelium with multiple superficial tears in all quadrants and  throughout the entire length of the vagina.  Approximation of these tears was impossible as the tissue would not hold suture.  Rectal examination revealed a small anal verge tear without damage to the rectal epithelium.  This was carefully repaired as a 4 degree would be with a multiple layer closure.  The patient continued to experience vaginal bleeding from her denuded vaginal epithelium.  The vagina was packed  to provide hemostasis.  2 units of packed cells were eventually given with excellent maternal recovery.  The packing was removed after approximately 36 hours and patient had no further significant bleeding.  At the time of packing removal of the posterior repaired area was intact.  The patient had rapid resolution of her pregnancy-induced hypertension with excellent diuresis and blood pressures in the normal range.                      Discharge Day SOAP Note:  Progress Note - Vaginal Delivery  [name] is a 32 y.o. G1P0101 now PP day 3 s/p Vaginal, Vacuum (Extractor) . Delivery was complicated by hypertension and multiple placental abnormalities.  Subjective  The patient has the  following complaints: has no unusual complaints  Pain is controlled with current medications.   Patient is urinating without difficulty.  She is ambulating well.    Objective  Vital signs: BP (!) 146/87 (BP Location: Right Arm)   Pulse 97   Temp 97.9 F (36.6 C) (Oral)   Resp 18   Ht 5\' 3"  (1.6 m)   Wt 203 lb (92.1 kg)   LMP 09/04/2016   SpO2 100%   Breastfeeding? Unknown   BMI 35.96 kg/m   Physical Exam: Gen: NAD Fundus Fundal Tone: Firm  Lochia Amount: Scant  Perineum Appearance: Edematous     Data Review Labs: CBC Latest Ref Rng & Units 02/03/2018 02/02/2018 02/02/2018  WBC 3.6 - 11.0 K/uL 19.8(H) 20.4(H) 17.9(H)  Hemoglobin 12.0 - 16.0 g/dL 1.6(X) 0.9(U) 0.4(V)  Hematocrit 35.0 - 47.0 % 26.8(L) 27.0(L) 27.6(L)  Platelets 150 - 440 K/uL 155 153 188   A POS Performed at [facility], [address] [place], [place] [address]   Assessment/Plan  Active Problems:   Labor and delivery indication for care or intervention   PIH (pregnancy induced hypertension)    Plan for discharge today.   Discharge Instructions: Per After Visit Summary. Activity: Advance as tolerated. Pelvic rest for 6 weeks.  Also refer to After Visit Summary Diet: Regular Medications: Allergies as of 02/04/2018   No Known Allergies     Medication List    STOP taking these medications   glyBURIDE 5 MG tablet Commonly known as:  DIABETA     TAKE these medications   conjugated estrogens vaginal cream Commonly known as:  PREMARIN 1/3 applicator in vagina before bedtime daily as directed.   DHA-EPA-VITAMIN E PO   freestyle lancets Use as instructed   glucose blood test strip Use as instructed   Prenatal Vitamin 27-0.8 MG Tabs   PROBIOTIC ADVANCED PO   psyllium 58.6 % powder Commonly known as:  METAMUCIL Take 1 packet by mouth daily.      Outpatient follow up:  Follow-up Information    Linzie Collin, MD Follow up.   Specialty:  Obstetrics and  Gynecology Contact information: [address] [place] [address] [phone]          Postpartum contraception: Will discuss at first office visit post-partum  Discharged Condition: good Patient discharged with instructions to keep her stool soft using Colace as directed .  Use iron supplementation with 1 meal per day.  Avoid straining with bowel movements.  Nothing in the vagina including tampons and intercourse.  Use of Premarin vaginal cream nightly.   Discharged to: home  Newborn Data: Disposition:NICU  Apgars: APGAR (1 MIN): 4   APGAR (5 MINS): 6   APGAR (10 MINS): 8       Elonda Husky, M.D. 02/04/2018 12:18 PM

## 2018-02-04 NOTE — Progress Notes (Deleted)
Pt discharged with infant.  Discharge instructions, prescriptions and follow up appointment given to and reviewed with pt. Pt verbalized understanding. Escorted out by auxillary. 

## 2018-02-10 ENCOUNTER — Encounter: Payer: 59 | Admitting: Obstetrics and Gynecology

## 2018-02-10 ENCOUNTER — Encounter: Payer: Self-pay | Admitting: Obstetrics and Gynecology

## 2018-02-10 ENCOUNTER — Ambulatory Visit (INDEPENDENT_AMBULATORY_CARE_PROVIDER_SITE_OTHER): Payer: 59 | Admitting: Obstetrics and Gynecology

## 2018-02-10 VITALS — BP 138/87 | HR 102 | Ht 63.0 in | Wt 178.0 lb

## 2018-02-10 DIAGNOSIS — O1092 Unspecified pre-existing hypertension complicating childbirth: Secondary | ICD-10-CM

## 2018-02-10 DIAGNOSIS — O164 Unspecified maternal hypertension, complicating childbirth: Secondary | ICD-10-CM

## 2018-02-10 NOTE — Progress Notes (Signed)
HPI:      [name] is a 32 y.o. G1P0101 who LMP was No LMP recorded.  Subjective:   She presents today 1 week postpartum.  Her labor and postpartum period were complicated by pregnancy-induced hypertension, nonreassuring NST and biophysical profile, traumatic vaginal delivery with anal verge tear, subsequent postpartum hemorrhage. Patient did well in the hospital immediately postpartum but did receive 2 units of packed red cells. Her baby was transferred to the NICU. She now reports she is currently doing well.  She has minimal vaginal bleeding.  She is using the Premarin cream as directed.  She continues to pump and use the mouth to feed the baby.  She states that she has lost more than 20 pounds since delivery.  She reports her swelling is much improved.  She reports no lightheadedness.  She is eating voiding and having bowel movements without issue.  She continues to keep her stool soft as directed. She reports that her baby is doing well.  She has met with the neurologist and he reports that he does not expect lasting neurologic issues.  They report there is no further seizures since transfer.  He is now in a crib, not on oxygen.  He no longer has apneic spells.     Hx: The following portions of the patient's history were reviewed and updated as appropriate:             She  has a past medical history of Diabetes mellitus without complication ([place]) and Hormone replacement therapy. She does not have any pertinent problems on file. She  has a past surgical history that includes Appendectomy. Her family history includes Diabetes in her mother; Hypertension in her mother. She  reports that she has never smoked. She has never used smokeless tobacco. She reports that she does not drink alcohol or use drugs. She has a current medication list which includes the following prescription(s): conjugated estrogens, dha-epa-vitamin e, docusate sodium, ibuprofen, prenatal vitamin, probiotic  product, and psyllium. She has No Known Allergies.       Review of Systems:  Review of Systems  Constitutional: Denied constitutional symptoms, night sweats, recent illness, fatigue, fever, insomnia and weight loss.  Eyes: Denied eye symptoms, eye pain, photophobia, vision change and visual disturbance.  Ears/Nose/Throat/Neck: Denied ear, nose, throat or neck symptoms, hearing loss, nasal discharge, sinus congestion and sore throat.  Cardiovascular: Denied cardiovascular symptoms, arrhythmia, chest pain/pressure, edema, exercise intolerance, orthopnea and palpitations.  Respiratory: Denied pulmonary symptoms, asthma, pleuritic pain, productive sputum, cough, dyspnea and wheezing.  Gastrointestinal: Denied, gastro-esophageal reflux, melena, nausea and vomiting.  Genitourinary: Denied genitourinary symptoms including symptomatic vaginal discharge, pelvic relaxation issues, and urinary complaints.  Musculoskeletal: Denied musculoskeletal symptoms, stiffness, swelling, muscle weakness and myalgia.  Dermatologic: Denied dermatology symptoms, rash and scar.  Neurologic: Denied neurology symptoms, dizziness, headache, neck pain and syncope.  Psychiatric: Denied psychiatric symptoms, anxiety and depression.  Endocrine: Denied endocrine symptoms including hot flashes and night sweats.   Meds:   Current Outpatient Medications on File Prior to Visit  Medication Sig Dispense Refill  . conjugated estrogens (PREMARIN) vaginal cream 1/3 applicator in vagina before bedtime daily as directed. 60 g 1  . DHA-EPA-VITAMIN E PO     . docusate sodium (COLACE) 100 MG capsule Take 100 mg by mouth 2 (two) times daily.    Marland Kitchen ibuprofen (ADVIL,MOTRIN) 200 MG tablet Take 200 mg by mouth every 6 (six) hours as needed.    . Prenatal Vit-Fe Fumarate-FA (PRENATAL VITAMIN)  27-0.8 MG TABS     . Probiotic Product (PROBIOTIC ADVANCED PO)     . psyllium (METAMUCIL) 58.6 % powder Take 1 packet by mouth daily.     No current  facility-administered medications on file prior to visit.     Objective:     Vitals:   02/10/18 0845  BP: 138/87  Pulse: (!) 102                Assessment:    G1P0101 Patient Active Problem List   Diagnosis Date Noted  . Labor and delivery indication for care or intervention 01/31/2018  . PIH (pregnancy induced hypertension) 01/31/2018  . Pregnancy 01/29/2018  . Proteinuria affecting pregnancy, antepartum 01/28/2018  . Elevated blood pressure affecting pregnancy, antepartum 01/28/2018  . Preeclampsia, third trimester 01/28/2018  . 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 Pregnancy resulting from in vitro fertilization 09/17/2017  . Type 2 diabetes mellitus in pregnancy, third trimester 09/17/2017  . Obesity (BMI 30.0-34.9) 08/20/2017  . Type 2 diabetes mellitus without complication, without long-term current use of insulin ([place]) 11/26/2016  . 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 Hypertension complicating childbirth     Patient doing well since delivery.     Plan:            1.  Continue current management.  Continue using Premarin cream.Continue taking iron daily.  Continue keeping stool soft. Orders No orders of the defined types were placed in this encounter.   No orders of the defined types were placed in this encounter.     F/U  Return in about 1 month (around 03/10/2018). I spent 16 minutes with this patient of which greater than 50% was spent discussing discussing maternal condition and condition of newborn in detail.  (See above).  Finis Bud, M.D. 02/10/2018 9:06 AM

## 2018-02-12 ENCOUNTER — Emergency Department
Admission: EM | Admit: 2018-02-12 | Discharge: 2018-02-13 | Disposition: A | Discharge status: Home / Self Care | Payer: 59 | Source: Home / Self Care | Attending: Emergency Medicine | Admitting: Emergency Medicine

## 2018-02-12 DIAGNOSIS — Z79899 Other long term (current) drug therapy: Secondary | ICD-10-CM | POA: Insufficient documentation

## 2018-02-12 DIAGNOSIS — E119 Type 2 diabetes mellitus without complications: Secondary | ICD-10-CM

## 2018-02-12 DIAGNOSIS — A419 Sepsis, unspecified organism: Secondary | ICD-10-CM

## 2018-02-12 DIAGNOSIS — R509 Fever, unspecified: Secondary | ICD-10-CM | POA: Diagnosis not present

## 2018-02-12 DIAGNOSIS — N12 Tubulo-interstitial nephritis, not specified as acute or chronic: Secondary | ICD-10-CM | POA: Insufficient documentation

## 2018-02-12 DIAGNOSIS — O85 Puerperal sepsis: Secondary | ICD-10-CM | POA: Diagnosis not present

## 2018-02-13 ENCOUNTER — Telehealth: Payer: Self-pay | Admitting: Emergency Medicine

## 2018-02-13 ENCOUNTER — Inpatient Hospital Stay
Admission: EM | Admit: 2018-02-13 | Discharge: 2018-02-17 | DRG: 776 | Disposition: A | Discharge status: Home / Self Care | Payer: 59 | Attending: Obstetrics and Gynecology | Admitting: Obstetrics and Gynecology

## 2018-02-13 ENCOUNTER — Encounter: Payer: Self-pay | Admitting: Emergency Medicine

## 2018-02-13 ENCOUNTER — Other Ambulatory Visit: Payer: Self-pay

## 2018-02-13 ENCOUNTER — Emergency Department: Payer: 59

## 2018-02-13 DIAGNOSIS — R509 Fever, unspecified: Secondary | ICD-10-CM

## 2018-02-13 DIAGNOSIS — O9081 Anemia of the puerperium: Secondary | ICD-10-CM | POA: Diagnosis not present

## 2018-02-13 DIAGNOSIS — O85 Puerperal sepsis: Secondary | ICD-10-CM | POA: Diagnosis not present

## 2018-02-13 DIAGNOSIS — R6510 Systemic inflammatory response syndrome (SIRS) of non-infectious origin without acute organ dysfunction: Secondary | ICD-10-CM

## 2018-02-13 DIAGNOSIS — Z8759 Personal history of other complications of pregnancy, childbirth and the puerperium: Secondary | ICD-10-CM

## 2018-02-13 DIAGNOSIS — R609 Edema, unspecified: Secondary | ICD-10-CM | POA: Diagnosis not present

## 2018-02-13 DIAGNOSIS — O8612 Endometritis following delivery: Secondary | ICD-10-CM | POA: Diagnosis not present

## 2018-02-13 DIAGNOSIS — E119 Type 2 diabetes mellitus without complications: Secondary | ICD-10-CM | POA: Diagnosis present

## 2018-02-13 DIAGNOSIS — R0602 Shortness of breath: Secondary | ICD-10-CM

## 2018-02-13 DIAGNOSIS — Z7984 Long term (current) use of oral hypoglycemic drugs: Secondary | ICD-10-CM

## 2018-02-13 DIAGNOSIS — A419 Sepsis, unspecified organism: Secondary | ICD-10-CM | POA: Diagnosis present

## 2018-02-13 DIAGNOSIS — O2413 Pre-existing diabetes mellitus, type 2, in the puerperium: Secondary | ICD-10-CM | POA: Diagnosis present

## 2018-02-13 HISTORY — DX: Essential (primary) hypertension: I10

## 2018-02-13 HISTORY — DX: Other ovarian dysfunction: E28.8

## 2018-02-13 HISTORY — DX: Other primary ovarian failure: E28.39

## 2018-02-13 LAB — BLOOD CULTURE ID PANEL (REFLEXED)
Acinetobacter baumannii: NOT DETECTED
CANDIDA GLABRATA: NOT DETECTED
CANDIDA KRUSEI: NOT DETECTED
CANDIDA PARAPSILOSIS: NOT DETECTED
CARBAPENEM RESISTANCE: NOT DETECTED
Candida albicans: NOT DETECTED
Candida tropicalis: NOT DETECTED
ENTEROCOCCUS SPECIES: NOT DETECTED
ESCHERICHIA COLI: NOT DETECTED
Enterobacter cloacae complex: NOT DETECTED
Enterobacteriaceae species: DETECTED — AB
Haemophilus influenzae: NOT DETECTED
KLEBSIELLA OXYTOCA: NOT DETECTED
KLEBSIELLA PNEUMONIAE: NOT DETECTED
LISTERIA MONOCYTOGENES: NOT DETECTED
Neisseria meningitidis: NOT DETECTED
Proteus species: NOT DETECTED
Pseudomonas aeruginosa: NOT DETECTED
SERRATIA MARCESCENS: NOT DETECTED
STAPHYLOCOCCUS AUREUS BCID: NOT DETECTED
STAPHYLOCOCCUS SPECIES: NOT DETECTED
STREPTOCOCCUS PNEUMONIAE: NOT DETECTED
STREPTOCOCCUS PYOGENES: NOT DETECTED
Streptococcus agalactiae: NOT DETECTED
Streptococcus species: NOT DETECTED

## 2018-02-13 LAB — CBC WITH DIFFERENTIAL/PLATELET
BASOS ABS: 0 10*3/uL (ref 0–0.1)
BASOS PCT: 0 %
BASOS PCT: 0 %
Basophils Absolute: 0 10*3/uL (ref 0–0.1)
EOS ABS: 0 10*3/uL (ref 0–0.7)
EOS ABS: 0 10*3/uL (ref 0–0.7)
EOS PCT: 1 %
Eosinophils Relative: 0 %
HCT: 28 % — ABNORMAL LOW (ref 35.0–47.0)
HEMATOCRIT: 25.5 % — AB (ref 35.0–47.0)
HEMOGLOBIN: 8.1 g/dL — AB (ref 12.0–16.0)
Hemoglobin: 9.3 g/dL — ABNORMAL LOW (ref 12.0–16.0)
LYMPHS ABS: 0.8 10*3/uL — AB (ref 1.0–3.6)
Lymphocytes Relative: 25 %
Lymphocytes Relative: 8 %
Lymphs Abs: 0.8 10*3/uL — ABNORMAL LOW (ref 1.0–3.6)
MCH: 28.2 pg (ref 26.0–34.0)
MCH: 29.2 pg (ref 26.0–34.0)
MCHC: 32 g/dL (ref 32.0–36.0)
MCHC: 33.3 g/dL (ref 32.0–36.0)
MCV: 87.7 fL (ref 80.0–100.0)
MCV: 88.1 fL (ref 80.0–100.0)
MONO ABS: 0 10*3/uL — AB (ref 0.2–0.9)
Monocytes Absolute: 0.1 10*3/uL — ABNORMAL LOW (ref 0.2–0.9)
Monocytes Relative: 0 %
Monocytes Relative: 2 %
NEUTROS ABS: 8.4 10*3/uL — AB (ref 1.4–6.5)
NEUTROS PCT: 90 %
Neutro Abs: 2.3 10*3/uL (ref 1.4–6.5)
Neutrophils Relative %: 74 %
PLATELETS: 334 10*3/uL (ref 150–440)
Platelets: 211 10*3/uL (ref 150–440)
RBC: 2.89 MIL/uL — AB (ref 3.80–5.20)
RBC: 3.19 MIL/uL — AB (ref 3.80–5.20)
RDW: 14.9 % — AB (ref 11.5–14.5)
RDW: 15.2 % — ABNORMAL HIGH (ref 11.5–14.5)
WBC: 3.2 10*3/uL — AB (ref 3.6–11.0)
WBC: 9.3 10*3/uL (ref 3.6–11.0)

## 2018-02-13 LAB — BASIC METABOLIC PANEL
ANION GAP: 7 (ref 5–15)
BUN: 10 mg/dL (ref 6–20)
CHLORIDE: 108 mmol/L (ref 101–111)
CO2: 19 mmol/L — AB (ref 22–32)
Calcium: 7.5 mg/dL — ABNORMAL LOW (ref 8.9–10.3)
Creatinine, Ser: 0.76 mg/dL (ref 0.44–1.00)
GFR calc non Af Amer: >60 mL/min (ref >60)
Glucose, Bld: 172 mg/dL — ABNORMAL HIGH (ref 65–99)
POTASSIUM: 4.2 mmol/L (ref 3.5–5.1)
SODIUM: 134 mmol/L — AB (ref 135–145)

## 2018-02-13 LAB — URINALYSIS, COMPLETE (UACMP) WITH MICROSCOPIC: Specific Gravity, Urine: 1.011 (ref 1.005–1.030)

## 2018-02-13 LAB — COMPREHENSIVE METABOLIC PANEL
ALT: 17 U/L (ref 14–54)
AST: 26 U/L (ref 15–41)
Albumin: 2.3 g/dL — ABNORMAL LOW (ref 3.5–5.0)
Alkaline Phosphatase: 150 U/L — ABNORMAL HIGH (ref 38–126)
Anion gap: 9 (ref 5–15)
BILIRUBIN TOTAL: 0.4 mg/dL (ref 0.3–1.2)
BUN: 9 mg/dL (ref 6–20)
CALCIUM: 8.2 mg/dL — AB (ref 8.9–10.3)
CO2: 21 mmol/L — ABNORMAL LOW (ref 22–32)
CREATININE: 0.74 mg/dL (ref 0.44–1.00)
Chloride: 102 mmol/L (ref 101–111)
Glucose, Bld: 189 mg/dL — ABNORMAL HIGH (ref 65–99)
Potassium: 3.5 mmol/L (ref 3.5–5.1)
Sodium: 132 mmol/L — ABNORMAL LOW (ref 135–145)
TOTAL PROTEIN: 5.8 g/dL — AB (ref 6.5–8.1)

## 2018-02-13 LAB — TYPE AND SCREEN
ABO/RH(D): A POS
ANTIBODY SCREEN: NEGATIVE

## 2018-02-13 LAB — LACTIC ACID, PLASMA
LACTIC ACID, VENOUS: 1.9 mmol/L (ref 0.5–1.9)
LACTIC ACID, VENOUS: 2.1 mmol/L — AB (ref 0.5–1.9)

## 2018-02-13 LAB — INFLUENZA PANEL BY PCR (TYPE A & B)
Influenza A By PCR: NEGATIVE
Influenza B By PCR: NEGATIVE

## 2018-02-13 LAB — PROTIME-INR
INR: 0.87
PROTHROMBIN TIME: 11.7 s (ref 11.4–15.2)

## 2018-02-13 MED ORDER — SODIUM CHLORIDE 0.9 % IV SOLN
1.0000 g | Freq: Once | INTRAVENOUS | Status: AC
  Administered 2018-02-13: 1 g via INTRAVENOUS
  Filled 2018-02-13: qty 10

## 2018-02-13 MED ORDER — SODIUM CHLORIDE 0.9 % IV BOLUS
1000.0000 mL | Freq: Once | INTRAVENOUS | Status: AC
  Administered 2018-02-13: 1000 mL via INTRAVENOUS

## 2018-02-13 MED ORDER — ACETAMINOPHEN 500 MG PO TABS
1000.0000 mg | ORAL_TABLET | Freq: Once | ORAL | Status: AC
  Administered 2018-02-13: 1000 mg via ORAL
  Filled 2018-02-13: qty 2

## 2018-02-13 MED ORDER — CEPHALEXIN 500 MG PO CAPS: 500.0000 mg | ORAL_CAPSULE | Freq: Four times a day (QID) | ORAL | 0 refills | Status: DC

## 2018-02-13 NOTE — ED Notes (Signed)
RN from labor and delivery at bedside to assist with breast pump use

## 2018-02-13 NOTE — ED Provider Notes (Signed)
[facility] Emergency Department Provider Note  ____________________________________________   I have reviewed the triage vital signs and the nursing notes.   HISTORY  Chief Complaint Fever  History limited by: Not Limited   HPI [name] is a 32 y.o. female who presents to the emergency department today because of concern for continued fever. The patient was seen in the emergency department last night for similar complaints. Was told she had a urinary tract infection and was given IV antibiotics. The patient did feel better when she initially went home however she started developing fever and difficulty breathing in the evening. The patient has not yet started the antibiotics that were prescribed to her last night. She did have a vaginal delivery roughly 2 weeks ago and has had some continued vaginal bleeding but states it has been improving. The patient has not noticed any other discharge. Has not had any lower abdominal pain.  Per medical record review patient has a history of DM, ER visit last night.   Past Medical History:  Diagnosis Date  . Diabetes mellitus without complication ([facility])   . Hormone replacement therapy     Patient Active Problem List   Diagnosis Date Noted  . Labor and delivery indication for care or intervention 01/31/2018  . PIH (pregnancy induced hypertension) 01/31/2018  . Pregnancy 01/29/2018  . Proteinuria affecting pregnancy, antepartum 01/28/2018  . Elevated blood pressure affecting pregnancy, antepartum 01/28/2018  . Preeclampsia, third trimester 01/28/2018  . 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 Pregnancy resulting from in vitro fertilization 09/17/2017  . Type 2 diabetes mellitus in pregnancy, third trimester 09/17/2017  . Obesity (BMI 30.0-34.9) 08/20/2017  . Type 2 diabetes mellitus without complication, without long-term current use of insulin ([facility]) 11/26/2016  . 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  Infertility associated with anovulation 11/26/2016    Past Surgical History:  Procedure Laterality Date  . APPENDECTOMY      Prior to Admission medications   Medication Sig Start Date End Date Taking? Authorizing Provider  cephALEXin (KEFLEX) 500 MG capsule Take 1 capsule (500 mg total) by mouth 4 (four) times daily for 10 days. 02/13/18 02/23/18  Merrily Brittle, MD  conjugated estrogens (PREMARIN) vaginal cream 1/3 applicator in vagina before bedtime daily as directed. 02/04/18   Linzie Collin, MD  DHA-EPA-VITAMIN E PO     [provider]  docusate sodium (COLACE) 100 MG capsule Take 100 mg by mouth 2 (two) times daily.    [provider]  ibuprofen (ADVIL,MOTRIN) 200 MG tablet Take 200 mg by mouth every 6 (six) hours as needed.    [provider]  Prenatal Vit-Fe Fumarate-FA (PRENATAL VITAMIN) 27-0.8 MG TABS Take 1 tablet by mouth daily.     [provider]  Probiotic Product (PROBIOTIC ADVANCED PO) Take 1 capsule by mouth daily.  04/16/16   [provider]  psyllium (METAMUCIL) 58.6 % powder Take 1 packet by mouth daily as needed.     [provider]    Allergies Patient has no known allergies.  Family History  Problem Relation Age of Onset  . Diabetes Mother   . Hypertension Mother     Social History Social History   Tobacco Use  . Smoking status: Never Smoker  . Smokeless tobacco: Never Used  Substance Use Topics  . Alcohol use: No  . Drug use: No    Review of Systems Constitutional: Positive for fever. Eyes: No visual changes. ENT: No sore throat. Cardiovascular: Denies chest pain. Respiratory:  Positive for shortness of breath. Gastrointestinal: No abdominal pain.  No nausea, no vomiting.  No diarrhea.   Genitourinary: Positive for vaginal bleeding. Musculoskeletal: Negative for back pain. Skin: Negative for rash. Neurological: Negative for headaches, focal weakness or  numbness.  ____________________________________________   PHYSICAL EXAM:  VITAL SIGNS: ED Triage Vitals  Enc Vitals Group     BP 103/63     Pulse 140     Resp 27     Temp 103.2     Temp src      SpO2 100   Constitutional: Alert and oriented. Well appearing and in no distress. Eyes: Conjunctivae are normal.  ENT   Head: Normocephalic and atraumatic.   Nose: No congestion/rhinnorhea.   Mouth/Throat: Mucous membranes are moist.   Neck: No stridor. Hematological/Lymphatic/Immunilogical: No cervical lymphadenopathy. Cardiovascular: Tachycardic, regular rhythm.  No murmurs, rubs, or gallops.  Respiratory: Tachypnea. Breath sounds are clear and equal bilaterally. No wheezes/rales/rhonchi. Gastrointestinal: Soft and non tender. No rebound. No guarding.  Genitourinary: Deferred Musculoskeletal: Normal range of motion in all extremities. No lower extremity edema. Neurologic:  Normal speech and language. No gross focal neurologic deficits are appreciated.  Skin:  Skin is warm, dry and intact. No rash noted. Psychiatric: Mood and affect are normal. Speech and behavior are normal. Patient exhibits appropriate insight and judgment.  ____________________________________________    LABS (pertinent positives/negatives)  Lactic 2.1 CBC wbc 9.3, hgb 8.1, plt 211 BMP na 134, k 4.2, glu 172, cr 0.76  ____________________________________________   EKG  I, Phineas SemenGraydon Milon Dethloff, attending physician, personally viewed and interpreted this EKG  EKG Time: 2001 Rate: 141 Rhythm: sinus tachycardia Axis: normal Intervals: qtc 437 QRS: narrow ST changes: no st elevation Impression: abnormal ekg   ____________________________________________    RADIOLOGY  None  ____________________________________________   PROCEDURES  Procedures  ____________________________________________   INITIAL IMPRESSION / ASSESSMENT AND PLAN / ED COURSE  Pertinent labs & imaging results  that were available during my care of the patient were reviewed by me and considered in my medical decision making (see chart for details).  Patient presented to the emergency department today because of concerns for continued fever tachycardia.  Patient was seen in the emergency department last night for similar symptoms and diagnosed with urinary tract infection pyelonephritis.  Patient's lactic acid level not 2.1.  Will plan on admission.  ____________________________________________   FINAL CLINICAL IMPRESSION(S) / ED DIAGNOSES  Final diagnoses:  Fever, unspecified fever cause     Note: This dictation was prepared with Dragon dictation. Any transcriptional errors that result from this process are unintentional     Phineas SemenGoodman, Drue Camera, MD 02/14/18 [phone]

## 2018-02-13 NOTE — Progress Notes (Signed)
\  PHARMACY - PHYSICIAN COMMUNICATION CRITICAL VALUE ALERT - BLOOD CULTURE IDENTIFICATION (BCID)  Mary-Ann Leslye PeerMuralidharan is an 32 y.o. female who [facility] with a chief complaint of pyleonephritis  Assessment:  Possible bacteremia but likely contaminant if 1/4 anaeriobic bottles gram - rods, enterobactereca kpc not detected (include suspected source if known)  Name of physician (or Provider) Contacted: Dr York CeriseForbach  Current antibiotics: cephalexin  Changes to prescribed antibiotics recommended:  Patient is on recommended antibiotics - No changes needed  Dr York CeriseForbach investigating with charge nurse in ED.   No results found for this or any previous visit.  Gerre PebblesGarrett Cierra Rothgeb 02/13/2018  3:25 PM

## 2018-02-13 NOTE — Discharge Instructions (Signed)
Take all of your antibiotics as prescribed and make sure you remain well-hydrated.  Follow-up with your primary care physician in 2 days for recheck and return to the emergency department sooner for any concerns whatsoever.  It was a pleasure to take care of you today, and thank you for coming to our emergency department.  If you have any questions or concerns before leaving please ask the nurse to grab me and I'm more than happy to go through your aftercare instructions again.  If you were prescribed any opioid pain medication today such as Norco, Vicodin, Percocet, morphine, hydrocodone, or oxycodone please make sure you do not drive when you are taking this medication as it can alter your ability to drive safely.  If you have any concerns once you are home that you are not improving or are in fact getting worse before you can make it to your follow-up appointment, please do not hesitate to call 911 and come back for further evaluation.  Merrily Brittle, MD  Results for orders placed or performed during the hospital encounter of 02/12/18  Comprehensive metabolic panel  Result Value Ref Range   Sodium 132 (L) 135 - 145 mmol/L   Potassium 3.5 3.5 - 5.1 mmol/L   Chloride 102 101 - 111 mmol/L   CO2 21 (L) 22 - 32 mmol/L   Glucose, Bld 189 (H) 65 - 99 mg/dL   BUN 9 6 - 20 mg/dL   Creatinine, Ser 1.61 0.44 - 1.00 mg/dL   Calcium 8.2 (L) 8.9 - 10.3 mg/dL   Total Protein 5.8 (L) 6.5 - 8.1 g/dL   Albumin 2.3 (L) 3.5 - 5.0 g/dL   AST 26 15 - 41 U/L   ALT 17 14 - 54 U/L   Alkaline Phosphatase 150 (H) 38 - 126 U/L   Total Bilirubin 0.4 0.3 - 1.2 mg/dL   GFR calc non Af Amer >60 >60 mL/min   GFR calc Af Amer >60 >60 mL/min   Anion gap 9 5 - 15  Lactic acid, plasma  Result Value Ref Range   Lactic Acid, Venous 1.9 0.5 - 1.9 mmol/L  CBC with Differential  Result Value Ref Range   WBC 3.2 (L) 3.6 - 11.0 K/uL   RBC 3.19 (L) 3.80 - 5.20 MIL/uL   Hemoglobin 9.3 (L) 12.0 - 16.0 g/dL   HCT 09.6 (L)  04.5 - 47.0 %   MCV 87.7 80.0 - 100.0 fL   MCH 29.2 26.0 - 34.0 pg   MCHC 33.3 32.0 - 36.0 g/dL   RDW 40.9 (H) 81.1 - 91.4 %   Platelets 334 150 - 440 K/uL   Neutrophils Relative % 74 %   Neutro Abs 2.3 1.4 - 6.5 K/uL   Lymphocytes Relative 25 %   Lymphs Abs 0.8 (L) 1.0 - 3.6 K/uL   Monocytes Relative 0 %   Monocytes Absolute 0.0 (L) 0.2 - 0.9 K/uL   Eosinophils Relative 1 %   Eosinophils Absolute 0.0 0 - 0.7 K/uL   Basophils Relative 0 %   Basophils Absolute 0.0 0 - 0.1 K/uL  Protime-INR  Result Value Ref Range   Prothrombin Time 11.7 11.4 - 15.2 seconds   INR 0.87   Urinalysis, Complete w Microscopic  Result Value Ref Range   Color, Urine RED (A) YELLOW   APPearance CLOUDY (A) CLEAR   Specific Gravity, Urine 1.011 1.005 - 1.030   pH  5.0 - 8.0    TEST NOT REPORTED DUE TO COLOR INTERFERENCE  OF URINE PIGMENT   Glucose, UA (A) NEGATIVE mg/dL    TEST NOT REPORTED DUE TO COLOR INTERFERENCE OF URINE PIGMENT   Hgb urine dipstick (A) NEGATIVE    TEST NOT REPORTED DUE TO COLOR INTERFERENCE OF URINE PIGMENT   Bilirubin Urine (A) NEGATIVE    TEST NOT REPORTED DUE TO COLOR INTERFERENCE OF URINE PIGMENT   Ketones, ur (A) NEGATIVE mg/dL    TEST NOT REPORTED DUE TO COLOR INTERFERENCE OF URINE PIGMENT   Protein, ur (A) NEGATIVE mg/dL    TEST NOT REPORTED DUE TO COLOR INTERFERENCE OF URINE PIGMENT   Nitrite (A) NEGATIVE    TEST NOT REPORTED DUE TO COLOR INTERFERENCE OF URINE PIGMENT   Leukocytes, UA (A) NEGATIVE    TEST NOT REPORTED DUE TO COLOR INTERFERENCE OF URINE PIGMENT   RBC / HPF TOO NUMEROUS TO COUNT 0 - 5 RBC/hpf   WBC, UA TOO NUMEROUS TO COUNT 0 - 5 WBC/hpf   Bacteria, UA RARE (A) NONE SEEN   Squamous Epithelial / LPF 0-5 (A) NONE SEEN   WBC Clumps PRESENT   Influenza panel by PCR (type A & B)  Result Value Ref Range   Influenza A By PCR NEGATIVE NEGATIVE   Influenza B By PCR NEGATIVE NEGATIVE   Dg Chest 2 View  Result Date: 02/13/2018 CLINICAL DATA:  32 year old  female with fever and possible sepsis. EXAM: CHEST - 2 VIEW COMPARISON:  None. FINDINGS: The heart size and mediastinal contours are within normal limits. Both lungs are clear. The visualized skeletal structures are unremarkable. IMPRESSION: No active cardiopulmonary disease. Electronically Signed   By: Elgie CollardArash  Radparvar M.D.   On: 02/13/2018 01:14   [place] Ob Follow Up  Result Date: 01/19/2018 ULTRASOUND REPORT   Location: [facility] Date of Service:  01/14/2018   Indications: BPP & Growth for DMII; 2 vessel cord with velamentous insertion Findings: Singleton intrauterine pregnancy is visualized with FHR at 128 BPM. Biometrics give an (U/S) Gestational age of [redacted] weeks and an (U/S) EDD of 03/11/18; this correlates with the clinically established EDD of 03/15/18. Fetal presentation is vertex. EFW: 1850 grams (4lb 1oz).  50th percentile.  Placenta: Anterior and grade 1 with a velamentous cord insertion. AFI: 9.4 cm.   2 vessel umbilical cord.   Fetal stomach, kidneys, and bladder appear WNL.   BPP: 8/8 with good visualization of fetal movement, tone, breathing, and fluid.   Impression: 1. 32 week Viable Singleton Intrauterine pregnancy by U/S. 2. (U/S) EDD is consistent with Clinically established (LMP) EDD of 03/15/18. 3. EFW: 1850 grams (4lb 1oz).  50th percentile. 4. BPP: 8/8 5. 2 vessel umbilical cord.   Recommendations: 1.Clinical correlation with the patient's History and Physical Exam.     Kari BaarsJill Long, RDMS   The ultrasound images and findings were reviewed by me and I agree with the above report. Elonda Huskyavid J. Evans, M.D. 01/19/2018 11:36 AM   [place] Fetal Bpp Wo Non Stress  Result Date: 01/31/2018 CLINICAL DATA:  Decreased fetal movement. EXAM: BIOPHYSICAL PROFILE FINDINGS: Movement: 0 time: 30 minutes Breathing: 0 Tone: 0 Amniotic Fluid: 2 Total Score: 2 Fetal heart rate of 158 beats per minute. AFI of 12.4 cm. Placenta anterior. IMPRESSION: Biophysical profile of 2/8 with 2 points for amniotic fluid volume but  0 points for movement, breathing and tone. These results will be called to the ordering clinician or representative by the Radiologist Assistant, and communication documented in the PACS or zVision Dashboard. Electronically Signed   By:  Rubye Oaks M.D.   On: 01/31/2018 22:12   US Fetal Bpp W/o Non Stress  Result Date: 01/19/2018 ULTRASOUND REPORT Location: [facility] Date of Service:  01/14/2018 Indications: BPP & Growth for DMII; 2 vessel cord with velamentous insertion Findings: Singleton intrauterine pregnancy is visualized with FHR at 128 BPM. Biometrics give an (U/S) Gestational age of [redacted] weeks and an (U/S) EDD of 03/11/18; this correlates with the clinically established EDD of 03/15/18. Fetal presentation is vertex. EFW: 1850 grams (4lb 1oz).  50th percentile.  Placenta: Anterior and grade 1 with a velamentous cord insertion. AFI: 9.4 cm. 2 vessel umbilical cord. Fetal stomach, kidneys, and bladder appear WNL. BPP: 8/8 with good visualization of fetal movement, tone, breathing, and fluid. Impression: 1. 32 week Viable Singleton Intrauterine pregnancy by U/S. 2. (U/S) EDD is consistent with Clinically established (LMP) EDD of 03/15/18. 3. EFW: 1850 grams (4lb 1oz).  50th percentile. 4. BPP: 8/8 5. 2 vessel umbilical cord. Recommendations: 1.Clinical correlation with the patient's History and Physical Exam. Kari Baars, RDMS The ultrasound images and findings were reviewed by me and I agree with the above report. Elonda Husky, M.D. 01/19/2018 11:35 AM

## 2018-02-13 NOTE — ED Notes (Signed)
Call back on report, Tyler Aasoris, RCharity fundraiser

## 2018-02-13 NOTE — ED Triage Notes (Signed)
Patient presents to Emergency Department via EMS with complaints of fever and rapid heart rate.    Pt was DC drom ED last night, vaginal birth on March 19 with "level 4 tear"  Hx DM

## 2018-02-13 NOTE — Telephone Encounter (Signed)
Patient called and informed of one positive culture out of the four collected.  Patient asked by this RN if she is feeling any better, having any fevers, etc.  Patient informed this RN that she is feeling much better and denies any fevers since leaving our facility. Patient instructed to follow back up with us if anything changes.

## 2018-02-13 NOTE — ED Notes (Signed)
Patient reports she normally uses a breast pump every 3 hours. Patient c/o breast fullness/discomfort. This RN called mother/baby to request a breast pump for patient. RN reported she would send a pump down. Patient informed.

## 2018-02-13 NOTE — ED Notes (Signed)
CRITICAL LAB: [facility]ACTIC is 2.1, [facility], Dr. Derrill KayGoodman notified, orders received

## 2018-02-13 NOTE — ED Triage Notes (Signed)
[facility]. Patient reports pre-eclampsia with pregnancy, denies other complications. Patient c/o fever/chills. Patient's temperature 108 tympanic at home. Patient's temperature 104.3 for EMS. patient given 650 mg tylenol by EMS in route. Patient ST for EMS, heart rate of 142.

## 2018-02-13 NOTE — ED Provider Notes (Signed)
[facility] Emergency Department Provider Note  ____________________________________________   First MD Initiated Contact with Patient 02/13/18 0000     (approximate)  I have reviewed the triage vital signs and the nursing notes.   HISTORY  Chief Complaint Fever   HPI [name] is a 32 y.o. female who comes to the emergency department by EMS with fever myalgias Reiger's and shaking chills that began this evening.  She was in her usual state of health until she noted uncontrollable shaking chills and began to sweat.  She checked her temperature at home and it was reportedly 108 degrees tympanic.  Per EMS she was 104.3 degrees.  She was given Tylenol in route.  She reports no particular pain at this time.  Her symptoms came on suddenly and were moderate to severe.  Improved with Tylenol and nothing particular seemed to make them worse.  She gave birth 2 weeks ago at [facility].  Her birth was vaginal and was complicated by a grade 4 tear.  Past Medical History:  Diagnosis Date  . Diabetes mellitus without complication (HCC)   . Hormone replacement therapy     Patient Active Problem List   Diagnosis Date Noted  . Labor and delivery indication for care or intervention 01/31/2018  . PIH (pregnancy induced hypertension) 01/31/2018  . Pregnancy 01/29/2018  . Proteinuria affecting pregnancy, antepartum 01/28/2018  . Elevated blood pressure affecting pregnancy, antepartum 01/28/2018  . Preeclampsia, third trimester 01/28/2018  . 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 Pregnancy resulting from in vitro fertilization 09/17/2017  . Type 2 diabetes mellitus in pregnancy, third trimester 09/17/2017  . Obesity (BMI 30.0-34.9) 08/20/2017  . Type 2 diabetes mellitus without complication, without long-term current use of insulin (HCC) 11/26/2016  . 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 APPENDECTOMY      Prior to Admission medications   Medication Sig Start Date End Date Taking? Authorizing Provider  conjugated estrogens (PREMARIN) vaginal cream 1/3 applicator in vagina before bedtime daily as directed. 02/04/18  Yes Linzie Collin, MD  DHA-EPA-VITAMIN E PO    Yes [provider]  docusate sodium (COLACE) 100 MG capsule Take 100 mg by mouth 2 (two) times daily.   Yes [provider]  ibuprofen (ADVIL,MOTRIN) 200 MG tablet Take 200 mg by mouth every 6 (six) hours as needed.   Yes [provider]  Prenatal Vit-Fe Fumarate-FA (PRENATAL VITAMIN) 27-0.8 MG TABS Take 1 tablet by mouth daily.    Yes [provider]  Probiotic Product (PROBIOTIC ADVANCED PO) Take 1 capsule by mouth daily.  04/16/16  Yes [provider]  psyllium (METAMUCIL) 58.6 % powder Take 1 packet by mouth daily as needed.    Yes [provider]  cephALEXin (KEFLEX) 500 MG capsule Take 1 capsule (500 mg total) by mouth 4 (four) times daily for 10 days. 02/13/18 02/23/18  Merrily Brittle, MD    Allergies Patient has no known allergies.  Family History  Problem Relation Age of Onset  . Diabetes Mother   . Hypertension Mother     Social History Social History   Tobacco Use  . Smoking status: Never Smoker  . Smokeless tobacco: Never Used  Substance Use Topics  . Alcohol use: No  . Drug use: No    Review of Systems Constitutional: Positive for fevers and chills Eyes: No visual changes. ENT: No sore throat. Cardiovascular: Denies  chest pain. Respiratory: Denies shortness of breath. Gastrointestinal: No abdominal pain.  No nausea, no vomiting.  No diarrhea.  No constipation. Genitourinary: Negative for dysuria. Musculoskeletal: Negative for back pain. Skin: Negative for rash. Neurological: Negative for headaches, focal weakness or numbness.   ____________________________________________   PHYSICAL  EXAM:  VITAL SIGNS: ED Triage Vitals  Enc Vitals Group     BP      Pulse      Resp      Temp      Temp src      SpO2      Weight      Height      Head Circumference      Peak Flow      Pain Score      Pain Loc      Pain Edu?      Excl. in GC?     Constitutional: Alert and oriented x4 pleasant cooperative although somewhat uncomfortable appearing Eyes: PERRL EOMI. Head: Atraumatic. Nose: No congestion/rhinnorhea. Mouth/Throat: No trismus Neck: No stridor.   Cardiovascular: Tachycardic rate, regular rhythm. Grossly normal heart sounds.  Good peripheral circulation. Respiratory: Slightly increased respiratory effort.  No retractions. Lungs CTAB and moving good air Gastrointestinal: Soft nondistended nontender no rebound or guarding no peritonitis no costovertebral tenderness Musculoskeletal: No lower extremity edema   Neurologic:  Normal speech and language. No gross focal neurologic deficits are appreciated. Skin: Slight diaphoresis Psychiatric: Mood and affect are normal. Speech and behavior are normal.    ____________________________________________   DIFFERENTIAL includes but not limited to  Sepsis, pyelonephritis, influenza, endometritis ____________________________________________   LABS (all labs ordered are listed, but only abnormal results are displayed)  Labs Reviewed  COMPREHENSIVE METABOLIC PANEL - Abnormal; Notable for the following components:      Result Value   Sodium 132 (*)    CO2 21 (*)    Glucose, Bld 189 (*)    Calcium 8.2 (*)    Total Protein 5.8 (*)    Albumin 2.3 (*)    Alkaline Phosphatase 150 (*)    All other components within normal limits  CBC WITH DIFFERENTIAL/PLATELET - Abnormal; Notable for the following components:   WBC 3.2 (*)    RBC 3.19 (*)    Hemoglobin 9.3 (*)    HCT 28.0 (*)    RDW 14.9 (*)    Lymphs Abs 0.8 (*)    Monocytes Absolute 0.0 (*)    All other components within normal limits  URINALYSIS, COMPLETE (UACMP)  WITH MICROSCOPIC - Abnormal; Notable for the following components:   Color, Urine RED (*)    APPearance CLOUDY (*)    Glucose, UA   (*)    Value: TEST NOT REPORTED DUE TO COLOR INTERFERENCE OF URINE PIGMENT   Hgb urine dipstick   (*)    Value: TEST NOT REPORTED DUE TO COLOR INTERFERENCE OF URINE PIGMENT   Bilirubin Urine   (*)    Value: TEST NOT REPORTED DUE TO COLOR INTERFERENCE OF URINE PIGMENT   Ketones, ur   (*)    Value: TEST NOT REPORTED DUE TO COLOR INTERFERENCE OF URINE PIGMENT   Protein, ur   (*)    Value: TEST NOT REPORTED DUE TO COLOR INTERFERENCE OF URINE PIGMENT   Nitrite   (*)    Value: TEST NOT REPORTED DUE TO COLOR INTERFERENCE OF URINE PIGMENT   Leukocytes, UA   (*)    Value: TEST NOT REPORTED DUE TO COLOR INTERFERENCE OF URINE PIGMENT  Bacteria, UA RARE (*)    Squamous Epithelial / LPF 0-5 (*)    All other components within normal limits  CULTURE, BLOOD (ROUTINE X 2)  CULTURE, BLOOD (ROUTINE X 2)  URINE CULTURE  LACTIC ACID, PLASMA  PROTIME-INR  INFLUENZA PANEL BY PCR (TYPE A & B)  POC URINE PREG, ED    Lab work reviewed by me with slight anemia but at her baseline.  Low white count could be consistent with sepsis __________________________________________  EKG  ED ECG REPORT I, Merrily Brittle, the attending physician, personally viewed and interpreted this ECG.  Date: 02/14/2018 EKG Time:  Rate: 132 Rhythm: Sinus tachycardia QRS Axis: Rightward axis Intervals: normal ST/T Wave abnormalities: normal Narrative Interpretation: no evidence of acute ischemia  ____________________________________________  RADIOLOGY  Chest x-ray reviewed by me with no acute disease. ____________________________________________   PROCEDURES  Procedure(s) performed: no  Procedures  Critical Care performed: no  Observation: no ____________________________________________   INITIAL IMPRESSION / ASSESSMENT AND PLAN / ED COURSE  Pertinent labs & imaging  results that were available during my care of the patient were reviewed by me and considered in my medical decision making (see chart for details).  Patient arrives febrile and tachycardic although relatively well-appearing.  Differential is broad but includes pyelonephritis, influenza, and endometritis.  Abdominal exam is benign.  Broad labs including influenza testing fluids and ceftriaxone are pending now.     ----------------------------------------- 3:03 AM on 02/13/2018 -----------------------------------------  After a gram of ceftriaxone and 2 L of fluid the patient feels significantly improved.  Her urinalysis is consistent with infection we have sent off for culture.  Do not suspect endometritis at this time and she is currently without pain.  Appreciate that she remains slightly tachycardic but this is likely secondary to her dehydration and infection.  She is able to eat and drink and at this point is medically stable for outpatient management.  Strict return precautions have been given and the patient verbalizes understanding and agreement with the plan. ____________________________________________   FINAL CLINICAL IMPRESSION(S) / ED DIAGNOSES  Final diagnoses:  Pyelonephritis  Sepsis, due to unspecified organism (HCC)      NEW MEDICATIONS STARTED DURING THIS VISIT:  New Prescriptions   CEPHALEXIN (KEFLEX) 500 MG CAPSULE    Take 1 capsule (500 mg total) by mouth 4 (four) times daily for 10 days.     Note:  This document was prepared using Dragon voice recognition software and may include unintentional dictation errors.     Merrily Brittle, MD 02/14/18 [phone]

## 2018-02-14 ENCOUNTER — Observation Stay: Payer: 59

## 2018-02-14 ENCOUNTER — Observation Stay (HOSPITAL_BASED_OUTPATIENT_CLINIC_OR_DEPARTMENT_OTHER)
Admit: 2018-02-14 | Discharge: 2018-02-14 | Disposition: A | Discharge status: Home / Self Care | Payer: 59 | Attending: Physician Assistant | Admitting: Physician Assistant

## 2018-02-14 DIAGNOSIS — R0602 Shortness of breath: Secondary | ICD-10-CM | POA: Diagnosis not present

## 2018-02-14 DIAGNOSIS — O8612 Endometritis following delivery: Secondary | ICD-10-CM | POA: Diagnosis not present

## 2018-02-14 DIAGNOSIS — R609 Edema, unspecified: Secondary | ICD-10-CM

## 2018-02-14 DIAGNOSIS — R509 Fever, unspecified: Secondary | ICD-10-CM

## 2018-02-14 DIAGNOSIS — R0600 Dyspnea, unspecified: Secondary | ICD-10-CM

## 2018-02-14 LAB — COMPREHENSIVE METABOLIC PANEL
ALK PHOS: 166 U/L — AB (ref 38–126)
ALT: 41 U/L (ref 14–54)
ANION GAP: 9 (ref 5–15)
AST: 47 U/L — ABNORMAL HIGH (ref 15–41)
Albumin: 2.4 g/dL — ABNORMAL LOW (ref 3.5–5.0)
BUN: 9 mg/dL (ref 6–20)
CALCIUM: 8.6 mg/dL — AB (ref 8.9–10.3)
CO2: 20 mmol/L — AB (ref 22–32)
Chloride: 109 mmol/L (ref 101–111)
Creatinine, Ser: 0.86 mg/dL (ref 0.44–1.00)
Glucose, Bld: 196 mg/dL — ABNORMAL HIGH (ref 65–99)
Potassium: 3.8 mmol/L (ref 3.5–5.1)
SODIUM: 138 mmol/L (ref 135–145)
TOTAL PROTEIN: 6.8 g/dL (ref 6.5–8.1)
Total Bilirubin: 0.7 mg/dL (ref 0.3–1.2)

## 2018-02-14 LAB — CBC
HCT: 31.1 % — ABNORMAL LOW (ref 35.0–47.0)
HEMOGLOBIN: 10.3 g/dL — AB (ref 12.0–16.0)
MCH: 30 pg (ref 26.0–34.0)
MCHC: 33.2 g/dL (ref 32.0–36.0)
MCV: 90.2 fL (ref 80.0–100.0)
Platelets: 216 10*3/uL (ref 150–440)
RBC: 3.45 MIL/uL — ABNORMAL LOW (ref 3.80–5.20)
RDW: 15.5 % — ABNORMAL HIGH (ref 11.5–14.5)
WBC: 10.9 10*3/uL (ref 3.6–11.0)

## 2018-02-14 LAB — URINE CULTURE

## 2018-02-14 LAB — PROTEIN / CREATININE RATIO, URINE: TOTAL PROTEIN, URINE: 40 mg/dL

## 2018-02-14 LAB — ECHOCARDIOGRAM COMPLETE
Height: 63 in
Weight: 2848 oz

## 2018-02-14 LAB — GLUCOSE, CAPILLARY
GLUCOSE-CAPILLARY: 234 mg/dL — AB (ref 65–99)
GLUCOSE-CAPILLARY: 110 mg/dL — AB (ref 65–99)

## 2018-02-14 LAB — LACTIC ACID, PLASMA: Lactic Acid, Venous: 2.3 mmol/L (ref 0.5–1.9)

## 2018-02-14 MED ORDER — ALUM & MAG HYDROXIDE-SIMETH 200-200-20 MG/5ML PO SUSP: 30.0000 mL | ORAL | Status: DC | PRN

## 2018-02-14 MED ORDER — BISACODYL 5 MG PO TBEC: 5.0000 mg | DELAYED_RELEASE_TABLET | Freq: Every day | ORAL | Status: DC | PRN

## 2018-02-14 MED ORDER — ACETAMINOPHEN 325 MG PO TABS
ORAL_TABLET | ORAL | Status: AC
  Filled 2018-02-14: qty 2

## 2018-02-14 MED ORDER — IOHEXOL 350 MG/ML SOLN
75.0000 mL | Freq: Once | INTRAVENOUS | Status: AC | PRN
  Administered 2018-02-14: 75 mL via INTRAVENOUS

## 2018-02-14 MED ORDER — IBUPROFEN 600 MG PO TABS: 600.0000 mg | ORAL_TABLET | Freq: Four times a day (QID) | ORAL | Status: DC | PRN

## 2018-02-14 MED ORDER — GLYBURIDE 5 MG PO TABS
5.0000 mg | ORAL_TABLET | Freq: Two times a day (BID) | ORAL | Status: DC
  Administered 2018-02-14 – 2018-02-16 (×4): 5 mg via ORAL
  Filled 2018-02-14 (×5): qty 1

## 2018-02-14 MED ORDER — GLYBURIDE 5 MG PO TABS
5.0000 mg | ORAL_TABLET | Freq: Two times a day (BID) | ORAL | Status: DC
  Filled 2018-02-14: qty 1

## 2018-02-14 MED ORDER — GENTAMICIN SULFATE 40 MG/ML IJ SOLN
1.5000 mg/kg | Freq: Once | INTRAVENOUS | Status: AC
  Administered 2018-02-14: 100 mg via INTRAVENOUS
  Filled 2018-02-14: qty 2.5

## 2018-02-14 MED ORDER — MAGNESIUM CITRATE PO SOLN
1.0000 | Freq: Once | ORAL | Status: DC | PRN
  Filled 2018-02-14: qty 296

## 2018-02-14 MED ORDER — SODIUM CHLORIDE 0.9 % IV SOLN
2.0000 g | Freq: Four times a day (QID) | INTRAVENOUS | Status: AC
  Administered 2018-02-14 – 2018-02-16 (×8): 2 g via INTRAVENOUS
  Filled 2018-02-14 (×8): qty 2000

## 2018-02-14 MED ORDER — ACETAMINOPHEN 325 MG PO TABS
650.0000 mg | ORAL_TABLET | ORAL | Status: DC | PRN
  Administered 2018-02-14 (×2): 650 mg via ORAL
  Filled 2018-02-14: qty 2

## 2018-02-14 MED ORDER — IOPAMIDOL (ISOVUE-370) INJECTION 76%: 75.0000 mL | Freq: Once | INTRAVENOUS | Status: DC | PRN

## 2018-02-14 MED ORDER — ONDANSETRON HCL 4 MG PO TABS: 4.0000 mg | ORAL_TABLET | Freq: Four times a day (QID) | ORAL | Status: DC | PRN

## 2018-02-14 MED ORDER — OXYCODONE-ACETAMINOPHEN 5-325 MG PO TABS: 1.0000 | ORAL_TABLET | ORAL | Status: DC | PRN

## 2018-02-14 MED ORDER — METFORMIN HCL 500 MG PO TABS
500.0000 mg | ORAL_TABLET | Freq: Two times a day (BID) | ORAL | Status: DC
  Filled 2018-02-14: qty 1

## 2018-02-14 MED ORDER — ZOLPIDEM TARTRATE 5 MG PO TABS: 5.0000 mg | ORAL_TABLET | Freq: Every evening | ORAL | Status: DC | PRN

## 2018-02-14 MED ORDER — GENTAMICIN SULFATE 40 MG/ML IJ SOLN
1.5000 mg/kg | Freq: Three times a day (TID) | INTRAVENOUS | Status: DC
  Administered 2018-02-14 – 2018-02-16 (×6): 100 mg via INTRAVENOUS
  Filled 2018-02-14 (×8): qty 2.5

## 2018-02-14 MED ORDER — CLINDAMYCIN PHOSPHATE 900 MG/50ML IV SOLN
900.0000 mg | Freq: Three times a day (TID) | INTRAVENOUS | Status: AC
  Administered 2018-02-14 (×3): 900 mg via INTRAVENOUS
  Filled 2018-02-14 (×4): qty 50

## 2018-02-14 MED ORDER — ONDANSETRON HCL 4 MG/2ML IJ SOLN: 4.0000 mg | Freq: Four times a day (QID) | INTRAMUSCULAR | Status: DC | PRN

## 2018-02-14 MED ORDER — FUROSEMIDE 10 MG/ML IJ SOLN
20.0000 mg | Freq: Once | INTRAMUSCULAR | Status: AC
  Administered 2018-02-14: 20 mg via INTRAVENOUS
  Filled 2018-02-14: qty 2

## 2018-02-14 MED ORDER — LACTATED RINGERS IV SOLN
INTRAVENOUS | Status: DC
  Administered 2018-02-14: 125 mL/h via INTRAVENOUS
  Administered 2018-02-15 (×2): via INTRAVENOUS

## 2018-02-14 MED ORDER — MAGNESIUM HYDROXIDE 400 MG/5ML PO SUSP: 30.0000 mL | Freq: Every day | ORAL | Status: DC | PRN

## 2018-02-14 MED ORDER — DOCUSATE SODIUM 100 MG PO CAPS
100.0000 mg | ORAL_CAPSULE | Freq: Two times a day (BID) | ORAL | Status: DC
  Administered 2018-02-14 – 2018-02-17 (×5): 100 mg via ORAL
  Filled 2018-02-14 (×5): qty 1

## 2018-02-14 NOTE — H&P (Signed)
Reason for Consult: Febrile morbidity Referring Physician: Nance Pear,   [name] is an 32 y.o. [id number] female who presented to the Emergency Room for complaints febrile morbidity.  Patient was seen in the ER 1 day prior for similar and was diagnosed with a UTI.  She was treated in the ER with a dose of IV Rocephin, and given a prescription for Cipro.  Patient presented again today with worsening fever, highest was 103. She also notes intermittent short episodes of dyspena that self resolve over the past 2-3 days. She denies dysuria, abdominal or pelvic pain, breast pain, foul smelling discharge.  Of note, patient is 2 weeks postpartum s/p VAVD, with delivery complicated by complex vaginal laceration, postpartum hemorrhage requiring blood transfusion 2 units PRBCs. Her antepartum course was complicated by IVF pregnancy (donor egg), Type II DM and severe pre-eclampsia (which led to her induction of labor at [redacted] weeks gestation).   Menstrual History: Menarche age: 45 No LMP recorded. Patient is postpartum.     Past Medical History:  Diagnosis Date  . Diabetes mellitus without complication ([place])   . Hormone replacement therapy   . Hypertension    during pregnancy  . Premature ovarian failure     Past Surgical History:  Procedure Laterality Date  . APPENDECTOMY      Family History  Problem Relation Age of Onset  . Diabetes Mother   . Hypertension Mother     Social History:  reports that she has never smoked. She has never used smokeless tobacco. She reports that she does not drink alcohol or use drugs.  Allergies: No Known Allergies  Medications:  Prior to Admission:  Medications Prior to Admission  Medication Sig Dispense Refill Last Dose  . conjugated estrogens (PREMARIN) vaginal cream 1/3 applicator in vagina before bedtime daily as directed. 60 g 1 02/13/2018 at Unknown time  . DHA-EPA-VITAMIN E PO    02/13/2018 at Unknown time  . docusate sodium (COLACE) 100 MG  capsule Take 100 mg by mouth 2 (two) times daily.   02/13/2018 at Unknown time  . ibuprofen (ADVIL,MOTRIN) 200 MG tablet Take 200 mg by mouth every 6 (six) hours as needed.   Past Week at prn  . Prenatal Vit-Fe Fumarate-FA (PRENATAL VITAMIN) 27-0.8 MG TABS Take 1 tablet by mouth daily.    02/13/2018 at Unknown time  . Probiotic Product (PROBIOTIC ADVANCED PO) Take 1 capsule by mouth daily.    02/13/2018 at Unknown time  . psyllium (METAMUCIL) 58.6 % powder Take 1 packet by mouth daily as needed.    Past Month at prn  . cephALEXin (KEFLEX) 500 MG capsule Take 1 capsule (500 mg total) by mouth 4 (four) times daily for 10 days. 40 capsule 0     Review of Systems  Constitutional: Positive for fever. Negative for chills, malaise/fatigue and weight loss.  HENT: Negative for congestion, ear discharge, ear pain, sinus pain and sore throat.   Eyes: Negative for blurred vision, pain, discharge and redness.  Respiratory: Positive for shortness of breath. Negative for cough, hemoptysis, sputum production and wheezing.   Cardiovascular: Positive for palpitations, orthopnea and leg swelling. Negative for chest pain.  Gastrointestinal: Negative for abdominal pain, constipation, heartburn and nausea.  Genitourinary: Negative for dysuria, flank pain, frequency, hematuria and urgency.  Musculoskeletal: Negative for back pain, joint pain, myalgias and neck pain.  Skin: Negative for itching and rash.  Neurological: Negative for dizziness, tingling, seizures, loss of consciousness and headaches.  Endo/Heme/Allergies: Negative for environmental allergies.  Does not bruise/bleed easily.  Psychiatric/Behavioral: Negative for depression, substance abuse and suicidal ideas.    Blood pressure (!) 97/57, pulse (!) 115, temperature (!) 103.2 F (39.6 C), temperature source Oral, resp. rate (!) 21, height _0  (1.6 m), weight 178 lb (80.7 kg), SpO2 100 %, currently breastfeeding. Physical Exam  Constitutional: She is  oriented to person, place, and time. She appears well-developed and well-nourished. No distress.  HENT:  Head: Normocephalic and atraumatic.  Right Ear: External ear normal.  Left Ear: External ear normal.  Nose: Nose normal.  Mouth/Throat: Oropharynx is clear and moist.  Eyes: Pupils are equal, round, and reactive to light. Conjunctivae are normal. Right eye exhibits no discharge. Left eye exhibits no discharge. No scleral icterus.  Neck: Normal range of motion. Neck supple. No thyromegaly present.  Cardiovascular: Normal rate, regular rhythm and normal heart sounds. Exam reveals no friction rub.  No murmur heard. Respiratory: Effort normal and breath sounds normal. No respiratory distress.  Breasts normal, no rashes or lesions  GI: Soft. Bowel sounds are normal. She exhibits no distension. There is no tenderness. There is no rebound.  Genitourinary:  Genitourinary Comments: Deferred  Musculoskeletal: Normal range of motion. She exhibits edema.  Pitting edema 2+ up to calves  Neurological: She is alert and oriented to person, place, and time.  Skin: Skin is warm and dry. No rash noted. No erythema. There is pallor.  Psychiatric: She has a normal mood and affect. Her behavior is normal. Judgment and thought content normal.    Results for orders placed or performed during the hospital encounter of 02/13/18 (from the past 48 hour(s))  CBC with Differential     Status: Abnormal   Collection Time: 02/13/18  8:05 PM  Result Value Ref Range   WBC 9.3 3.6 - 11.0 K/uL   RBC 2.89 (L) 3.80 - 5.20 MIL/uL   Hemoglobin 8.1 (L) 12.0 - 16.0 g/dL   HCT 25.5 (L) 35.0 - 47.0 %   MCV 88.1 80.0 - 100.0 fL   MCH 28.2 26.0 - 34.0 pg   MCHC 32.0 32.0 - 36.0 g/dL   RDW 15.2 (H) 11.5 - 14.5 %   Platelets 211 150 - 440 K/uL   Neutrophils Relative % 90 %   Neutro Abs 8.4 (H) 1.4 - 6.5 K/uL   Lymphocytes Relative 8 %   Lymphs Abs 0.8 (L) 1.0 - 3.6 K/uL   Monocytes Relative 2 %   Monocytes Absolute 0.1  (L) 0.2 - 0.9 K/uL   Eosinophils Relative 0 %   Eosinophils Absolute 0.0 0 - 0.7 K/uL   Basophils Relative 0 %   Basophils Absolute 0.0 0 - 0.1 K/uL    Comment: Performed at [facility], [address] [place], [place] [address]  Basic metabolic panel     Status: Abnormal   Collection Time: 02/13/18  8:05 PM  Result Value Ref Range   Sodium 134 (L) 135 - 145 mmol/L   Potassium 4.2 3.5 - 5.1 mmol/L   Chloride 108 101 - 111 mmol/L   CO2 19 (L) 22 - 32 mmol/L   Glucose, Bld 172 (H) 65 - 99 mg/dL   BUN 10 6 - 20 mg/dL   Creatinine, Ser 0.76 0.44 - 1.00 mg/dL   Calcium 7.5 (L) 8.9 - 10.3 mg/dL   GFR calc non Af Amer >60 >60 mL/min   GFR calc Af Amer >60 >60 mL/min    Comment: (NOTE) The eGFR has been calculated using the  CKD EPI equation. This calculation has not been validated in all clinical situations. eGFR's persistently <60 mL/min signify possible Chronic Kidney Disease.    Anion gap 7 5 - 15    Comment: Performed at [facility], [place], [place] [address]  Lactic acid, plasma     Status: Abnormal   Collection Time: 02/13/18  8:05 PM  Result Value Ref Range   Lactic Acid, Venous 2.1 (HH) 0.5 - 1.9 mmol/L    Comment: CRITICAL RESULT CALLED TO, READ BACK BY AND VERIFIED WITH NOEL WEBSTER ON 02/13/18 AT 2039 [facility] Performed at [facility], [address] [place], [place] [address]   Type and screen [place]     Status: None   Collection Time: 02/13/18  8:05 PM  Result Value Ref Range   ABO/RH(D) A POS    Antibody Screen NEG    Sample Expiration      02/16/2018 Performed at [facility], [address] [place], [place] [address]      Urinalysis    Component Value Date/Time   COLORURINE RED (A) 02/13/2018 0027   APPEARANCEUR CLOUDY (A) 02/13/2018 0027   APPEARANCEUR Clear 08/20/2017 1513   LABSPEC 1.011 02/13/2018 0027   PHURINE  02/13/2018 0027    TEST NOT REPORTED DUE TO COLOR  INTERFERENCE OF URINE PIGMENT   GLUCOSEU (A) 02/13/2018 0027    TEST NOT REPORTED DUE TO COLOR INTERFERENCE OF URINE PIGMENT   HGBUR (A) 02/13/2018 0027    TEST NOT REPORTED DUE TO COLOR INTERFERENCE OF URINE PIGMENT   BILIRUBINUR (A) 02/13/2018 0027    TEST NOT REPORTED DUE TO COLOR INTERFERENCE OF URINE PIGMENT   BILIRUBINUR neg 01/31/2018 1442   BILIRUBINUR Negative 08/20/2017 1513   KETONESUR (A) 02/13/2018 0027    TEST NOT REPORTED DUE TO COLOR INTERFERENCE OF URINE PIGMENT   PROTEINUR (A) 02/13/2018 0027    TEST NOT REPORTED DUE TO COLOR INTERFERENCE OF URINE PIGMENT   UROBILINOGEN 0.2 01/31/2018 1442   NITRITE (A) 02/13/2018 0027    TEST NOT REPORTED DUE TO COLOR INTERFERENCE OF URINE PIGMENT   LEUKOCYTESUR (A) 02/13/2018 0027    TEST NOT REPORTED DUE TO COLOR INTERFERENCE OF URINE PIGMENT   LEUKOCYTESUR Negative 08/20/2017 1513     Urine culture pending   Dg Chest 2 View  Result Date: 02/13/2018 CLINICAL DATA:  32 year old female with fever and possible sepsis. EXAM: CHEST - 2 VIEW COMPARISON:  None. FINDINGS: The heart size and mediastinal contours are within normal limits. Both lungs are clear. The visualized skeletal structures are unremarkable. IMPRESSION: No active cardiopulmonary disease. Electronically Signed   By: Anner Crete M.D.   On: 02/13/2018 01:14    Assessment/Plan: 1. SIRS - patient with febrile morbidity, tachycardia, and increased respiratory rate.  WBC count within normal range, however is elevated from yesterday's value of 3.2 (now currently 9.3)  Was treated with a dose of Rocephin yesterday, and today in triage due to suspected UTI, however on review of labs, is difficult to assess the presence of a UTI (as UA only significant finding is WBCs).  Pending Urine culture.  Blood cultures ordered today by ER physician, will f/u.  Mildly elevated lactic acid of 2.1.  Differential diagnosis besides UTI also includes postpartum endometritis, mastitis  (although no evidence of infection of the breasts).  Will treat for working diagnosis of postpartum endometritis as patient had many risk factors to develop this (VAVD, complex vaginal laceration requiring  packing, GBS+ antenatal screen). IV Gentamycin and Clindamycin ordered. Will treat for 24-48 hrs until patient is no longer febrile, and then change to PO antibiotics.   2. Postpartum state - patient currently is no longer breastfeeding. Infant is a [facility] in NICU for prematurity, and r/o seizure morbidity. Vaginal bleeding has been normal.   3. Anemia - postpartum secondary to postpartum hemorrhage. Patient's Hgb currently stable today, has not changed significantly since discharge  4. H/o pre-eclampsia - BPs within low-normal range currently due to SIRS.  Will continue to monitor as patient does still have significant peripheral edema. Will continue IVF for low BP.   5. H/o Type II DM - will place patient back on Metformin (was on this prior to pregnancy).   Rubie Maid, MD [facility]  02/14/2018

## 2018-02-14 NOTE — Plan of Care (Signed)
Pt. Admitted to room 347. Alert and oriented with cheerful affect. Color good, skin w&d. BBS clear. Positive Pedal pulses equal and strong with cap. Refill < 3 sec.'s. Has scant amount of dark brown Lochia. U/-1. 2 Plus pitting edema to top of feet bilat. Pt. States this is improved since delivery and she denies any other PIH S/S. Pt. Is Pumping ass she is 10 das PP for her [facility] at [redacted] weeks Gestation. Pt. And Sig. Other oriented to room, Falls Policy and POC. V/O. Pt. Has had 2 Liquid stools. One in E.R. Prior to arrival on Unit and the second upon admission. Denies N&V. Positive Bowel sounds. Abdomen is soft and Non-Tender to mild Palpation.

## 2018-02-14 NOTE — Progress Notes (Signed)
*  PRELIMINARY RESULTS* Echocardiogram 2D Echocardiogram has been performed.  Cristela BlueHege, Theoplis Garciagarcia 02/14/2018, 1:01 PM

## 2018-02-14 NOTE — Progress Notes (Signed)
Notify Roxy Horsemanourtney Greene, RN

## 2018-02-14 NOTE — Progress Notes (Signed)
(Late Entry)  Hospital Day # 1, currently admitted for febrile morbidity, suspected postpartum endometritis.  She is approximately 2 weeks postpartum from SVD (at [redacted] weeks gestation secondary to pre-eclampsia).   Subjective: patient complains of SOB with onset at approximately 0600.  Notes inability to lie supine. Denies chest pain. Does note new onset of cough this morning. Also notes mild fever.   Objective: Temp:  [97.8 F (36.6 C)-103.2 F (39.6 C)] 101.6 F (38.7 C) (04/01 1012) Pulse Rate:  [103-140] 130 (04/01 0959) Resp:  [17-30] 28 (04/01 0959) BP: (91-150)/(43-108) 115/91 (04/01 0959) SpO2:  [93 %-100 %] 100 % (04/01 0959) Weight:  [178 lb (80.7 kg)] 178 lb (80.7 kg) (03/31 2010)  Physical Exam:  General: alert and mild distress  Lungs: clear to auscultation bilaterally Heart: regular rate and rhythm, S1, S2 normal, no murmur, click, rub or gallop Abdomen: soft, non-tender; bowel sounds normal; no masses,  no organomegaly Pelvis:Bleeding: appropriate,  Incision: None Extremities: DVT Evaluation: No evidence of DVT seen on physical exam. No cords or calf tenderness. Calf/Ankle edema is present (+2 pitting edema of feet up to calf).    Results for [name], Zaleigh (MRN [id number]) as of 02/14/2018 11:04  Ref. Range 02/13/2018 20:05  BASIC METABOLIC PANEL Unknown Rpt (A)  Sodium Latest Ref Range: 135 - 145 mmol/L 134 (L)  Potassium Latest Ref Range: 3.5 - 5.1 mmol/L 4.2  Chloride Latest Ref Range: 101 - 111 mmol/L 108  CO2 Latest Ref Range: 22 - 32 mmol/L 19 (L)  Glucose Latest Ref Range: 65 - 99 mg/dL 409172 (H)  BUN Latest Ref Range: 6 - 20 mg/dL 10  Creatinine Latest Ref Range: 0.44 - 1.00 mg/dL 8.110.76  Calcium Latest Ref Range: 8.9 - 10.3 mg/dL 7.5 (L)  Anion gap Latest Ref Range: 5 - 15  7  GFR, Est Non African American Latest Ref Range: >60 mL/min >60  GFR, Est African American Latest Ref Range: >60 mL/min >60  Lactic Acid, Venous Latest Ref Range: 0.5 - 1.9 mmol/L  2.1 (HH)  WBC Latest Ref Range: 3.6 - 11.0 K/uL 9.3  RBC Latest Ref Range: 3.80 - 5.20 MIL/uL 2.89 (L)  Hemoglobin Latest Ref Range: 12.0 - 16.0 g/dL 8.1 (L)  HCT Latest Ref Range: 35.0 - 47.0 % 25.5 (L)  MCV Latest Ref Range: 80.0 - 100.0 fL 88.1  MCH Latest Ref Range: 26.0 - 34.0 pg 28.2  MCHC Latest Ref Range: 32.0 - 36.0 g/dL 91.432.0  RDW Latest Ref Range: 11.5 - 14.5 % 15.2 (H)  Platelets Latest Ref Range: 150 - 440 K/uL 211  Neutrophils Latest Units: % 90  Lymphocytes Latest Units: % 8  Monocytes Relative Latest Units: % 2  Eosinophil Latest Units: % 0  Basophil Latest Units: % 0  NEUT# Latest Ref Range: 1.4 - 6.5 K/uL 8.4 (H)  Lymphocyte # Latest Ref Range: 1.0 - 3.6 K/uL 0.8 (L)  Monocyte # Latest Ref Range: 0.2 - 0.9 K/uL 0.1 (L)  Eosinophils Absolute Latest Ref Range: 0 - 0.7 K/uL 0.0  Basophils Absolute Latest Ref Range: 0 - 0.1 K/uL 0.0      Assessment/Plan:  1. SOB - unclear cause. Patient is at risk for postpartum cardiomyopathy, DVT/PE in postpartum state. Extremity exam does not reveal any evidence of DVT. Will order CXR, and likely CT scan to rule out above.  2. Febrile morbidity - currently being treated for postpartum endometritis (Gentamycin and Clindamycin), received Rocephin in the ER last night prior to admission  to Gynecology service due to suspicion for UTI.  Patient with several low grade temps overnight after admission (highest was 100.9),  Most recent temp was 99.2.  WBC count was not elevated at time of admission, but will repeat today. Awaiting blood and urine cultures. Elevated lactic acid, repeat ordered.  3. Labile BPs - patient with h/o pre-eclampsia. BPs labile,.  Will reorder pre-eclampsia labs to assess if she needs to be resumed on Magnesium Sulfate.  4. Anemia - patient with Hgb 8.1. Stable from previous levels at time of discharge from hospital.  5. Pitting edema, will order 1 dose of Lasix 20 mg for diuresis.    Transition of care to Dr.  Greggory Keen, a.m. team    Hildred Laser, MD [facility]

## 2018-02-14 NOTE — Progress Notes (Signed)
Patient ID: [name], female   DOB: Feb 18, 1986, 32 y.o.   MRN: [id number]  OB/GYN note: Hospital day 1-interval summary  32 year old female para 0101, status post vacuum-assisted delivery (02/01/2018) at [redacted] weeks gestation due to severe preeclampsia and a second stage category 2 tracing, with delivery complicated by uterine atony and multiple vaginal lacerations with denuding of the vagina which required packing of vagina postpartum for hemorrhage prevention; status post 2 unit packed red blood cells for postpartum anemia.  The patient's posttransfusion H&H was 8.7 and 26.8 on 02/03/2018.   On 02/13/2018 the patient was seen in the emergency room shortly after midnight for fever, myalgias, and shaking chills with Reiger's.  Chest x-ray was negative.  EKG revealed sinus tachycardia without evidence of ischemia.  H&H was 9.3 and 28.0 and white blood cell count was 3.2.  Urine cultures and blood cultures were obtained.  Influenza panel was negative.  The patient received 2 L of normal saline and a gram of ceftriaxone.  She was discharged home with a final diagnosis of pyelonephritis and possible sepsis and was placed on Keflex p.o. Antibiotics.  1 of the blood cultures returned positive for Enterobacteriaceae.  On 02/13/2018 patient returned to the emergency room at 8 PM when she developed recurrent fever, shortness of breath; she had not started taking the Keflex antibiotics at that time.  Temperature was 103.2 with a pulse of 140 and blood pressure 103/63, pulse ox 100%.  Lactic acid was 2.1.  Patient was admitted to the postpartum floor following that ER evaluation.  The patient was given 100 mg gentamicin IV antibiotics, which was followed by clindamycin at 10 AM.  In the morning on 02/14/2018, the patient had significant tachypnea with respiratory stridor, pulse ox of 94% on room air, and was given 20 mg of Lasix with significant diuresis-800 mL. Repeat chest x-ray demonstrated bilateral pulmonary  infiltrates possibly consistent with noncardiogenic pulmonary edema; pulse ox on 2 L nasal cannula was 100%.  CT of chest ruled out pulmonary embolus.  Lab testing on 02/14/2018 was notable for the CMP demonstrating normal serum electrolytes, slightly elevated serum creatinine at 0.86 (up from 0.71), slightly elevated AST at 47; CBC reveals an H&H of 10.3/31.1 and a white blood cell count of 10.9.  Lactic acid was 2.3  Physical exam-limited: Pleasant slightly anxious female sitting up in bed while blood is being drawn for morning labs Nasal cannula in place; pulse ox 98% Respiratory rate-20 Lungs: Decreased breath sounds bilaterally Heart: Rate 130; no murmur; no S3 or S4 audible Extremities: Warm and dry  Assessment (current) 1.  2 weeks status post vacuum-assisted delivery at [redacted] weeks gestation, complicated by uterine atony with postpartum hemorrhage, multiple vaginal lacerations and denuding which required 36 hours of vaginal packing. 2.  Postpartum anemia treated with 2 unit packed red blood cell transfusion. 3.  History of severe preeclampsia with labile blood pressures postpartum 4.  Current readmission for fever for suspected sepsis related to probable endometritis status post Rocephin x1 dose, gentamicin x1 dose, and clindamycin x1 dose 5.  Persistent maternal tachycardia, labile blood pressures, and tachypnea; cannot rule out postpartum cardiomyopathy  Plan: 1.  Start triple antibiotic regimen with ampicillin 2 g IV every 6 hours, gentamicin 1 g every 8 hours, and clindamycin 900 mg every 8 hours 2.  Cardiology consultation with echo for assessment of possible peripartum cardio myopathy 3.  Close monitoring of urine output/vital signs  Herold Harms, MD  Note: This dictation was prepared with  Dragon dictation along with smaller Lobbyistphrase technology. Any transcriptional errors that result from this process are unintentional.

## 2018-02-14 NOTE — Progress Notes (Signed)
Notify Courtney Greene, RN 

## 2018-02-14 NOTE — Progress Notes (Signed)
Consulted for possible peripartum cardiomyopathy.  A stat echocardiogram was performed which showed normal LV systolic function and normal wall motion.  Valvular structures appeared normal.  There is no evidence of cardiomyopathy on echocardiogram.  The patient's presentation seems to be infectious in etiology.  Please call us if any further questions.

## 2018-02-14 NOTE — Progress Notes (Signed)
Delay in Lab Draw because Lab not able to locate a vein for labs.  Delay in CT and Chest Xray because of Lab delay.

## 2018-02-15 DIAGNOSIS — A419 Sepsis, unspecified organism: Secondary | ICD-10-CM | POA: Diagnosis present

## 2018-02-15 DIAGNOSIS — O85 Puerperal sepsis: Secondary | ICD-10-CM | POA: Diagnosis present

## 2018-02-15 DIAGNOSIS — R609 Edema, unspecified: Secondary | ICD-10-CM | POA: Diagnosis not present

## 2018-02-15 DIAGNOSIS — Z7984 Long term (current) use of oral hypoglycemic drugs: Secondary | ICD-10-CM | POA: Diagnosis not present

## 2018-02-15 DIAGNOSIS — O9081 Anemia of the puerperium: Secondary | ICD-10-CM | POA: Diagnosis present

## 2018-02-15 DIAGNOSIS — O2413 Pre-existing diabetes mellitus, type 2, in the puerperium: Secondary | ICD-10-CM | POA: Diagnosis present

## 2018-02-15 DIAGNOSIS — E119 Type 2 diabetes mellitus without complications: Secondary | ICD-10-CM | POA: Diagnosis present

## 2018-02-15 DIAGNOSIS — R0602 Shortness of breath: Secondary | ICD-10-CM | POA: Diagnosis not present

## 2018-02-15 DIAGNOSIS — R509 Fever, unspecified: Secondary | ICD-10-CM | POA: Diagnosis present

## 2018-02-15 LAB — CULTURE, BLOOD (ROUTINE X 2)
Special Requests: ADEQUATE
Special Requests: ADEQUATE

## 2018-02-15 LAB — GLUCOSE, CAPILLARY
GLUCOSE-CAPILLARY: 151 mg/dL — AB (ref 65–99)
GLUCOSE-CAPILLARY: 194 mg/dL — AB (ref 65–99)
GLUCOSE-CAPILLARY: 151 mg/dL — AB (ref 65–99)

## 2018-02-15 NOTE — Progress Notes (Addendum)
OBGYN PROGRESS NOTE:  Hospital Day 2 Antibiotic Day 2-Amp/Gent/Cleo   SUBJECTIVE: Patient had better evening last night.  Breathing is improved.  Chills and sweats have diminished.  She is tolerating a regular diet.  Urine output is good.  She continues to pump her breast milk.  She is not complaining of any focal signs of pain. OBJECTIVE:  BP 126/76 (BP Location: Left Arm)   Pulse (!) 101   Temp 97.6 F (36.4 C) (Oral)   Resp 20   Ht 5\' 3"  (1.6 m)   Wt 184 lb (83.5 kg)   SpO2 100%   Breastfeeding? Yes   BMI 32.59 kg/m  T-max 101.2 (trend downward) Alert and oriented female in no acute distress. Neck: Supple Lungs: Clear Heart: Regular rhythm, mild tachycardia, no murmur, no S3 or S4 Back: No CVA tenderness Abdomen: Soft, nondistended; no uterine tenderness Extremities: Warm and dry; 2+ pretibial edema persists  A.m. blood sugar: 151  Echocardiogram: Normal left ventricular function; EF 55-60%  Blood culture: Positive for Citrobacter koseri-no drug resistance, sensitive to cefazolin and gentamicin   ASSESSMENT: 1.  Postpartum sepsis, clinically improving; T-max (101.2) less than the previous 24 hours (101.4) 2.  Day 2 ampicillin/gentamicin/Cleocin 3.  Normal echo; no evidence of peripartum cardiomyopathy 4.  Blood pressures  continuing to fluctuate without severe range findings 5.  Gestational diabetes mellitus, on glyburide 5 mg twice daily  PLAN: 1.  Continue amp/gent/Cleo 2.  Daily weights 3.  Monitor vital signs 4.  CBC/CMP in a.m.  Herold HarmsMartin A Zalyn Amend, MD  Note: This dictation was prepared with Dragon dictation along with smaller phrase technology. Any transcriptional errors that result from this process are unintentional.  1

## 2018-02-16 DIAGNOSIS — O85 Puerperal sepsis: Principal | ICD-10-CM

## 2018-02-16 DIAGNOSIS — Z8759 Personal history of other complications of pregnancy, childbirth and the puerperium: Secondary | ICD-10-CM

## 2018-02-16 LAB — CBC WITH DIFFERENTIAL/PLATELET
BASOS ABS: 0 10*3/uL (ref 0–0.1)
Basophils Relative: 1 %
EOS ABS: 0.2 10*3/uL (ref 0–0.7)
EOS PCT: 3 %
HCT: 27.8 % — ABNORMAL LOW (ref 35.0–47.0)
HEMOGLOBIN: 9.2 g/dL — AB (ref 12.0–16.0)
LYMPHS PCT: 31 %
Lymphs Abs: 2.3 10*3/uL (ref 1.0–3.6)
MCH: 29 pg (ref 26.0–34.0)
MCHC: 33.3 g/dL (ref 32.0–36.0)
MCV: 87.1 fL (ref 80.0–100.0)
Monocytes Absolute: 0.4 10*3/uL (ref 0.2–0.9)
Monocytes Relative: 6 %
NEUTROS PCT: 61 %
Neutro Abs: 4.7 10*3/uL (ref 1.4–6.5)
PLATELETS: 218 10*3/uL (ref 150–440)
RBC: 3.19 MIL/uL — AB (ref 3.80–5.20)
RDW: 15.6 % — ABNORMAL HIGH (ref 11.5–14.5)
WBC: 7.7 10*3/uL (ref 3.6–11.0)

## 2018-02-16 LAB — COMPREHENSIVE METABOLIC PANEL
ALK PHOS: 125 U/L (ref 38–126)
ALT: 22 U/L (ref 14–54)
AST: 19 U/L (ref 15–41)
Albumin: 2.2 g/dL — ABNORMAL LOW (ref 3.5–5.0)
Anion gap: 7 (ref 5–15)
BUN: 7 mg/dL (ref 6–20)
CALCIUM: 9 mg/dL (ref 8.9–10.3)
CHLORIDE: 102 mmol/L (ref 101–111)
CO2: 25 mmol/L (ref 22–32)
Creatinine, Ser: 0.62 mg/dL (ref 0.44–1.00)
GFR calc non Af Amer: >60 mL/min (ref >60)
GLUCOSE: 166 mg/dL — AB (ref 65–99)
Potassium: 3.9 mmol/L (ref 3.5–5.1)
SODIUM: 134 mmol/L — AB (ref 135–145)
Total Bilirubin: 0.4 mg/dL (ref 0.3–1.2)
Total Protein: 6.3 g/dL — ABNORMAL LOW (ref 6.5–8.1)

## 2018-02-16 LAB — GLUCOSE, CAPILLARY
GLUCOSE-CAPILLARY: 173 mg/dL — AB (ref 65–99)
Glucose-Capillary: 257 mg/dL — ABNORMAL HIGH (ref 65–99)
Glucose-Capillary: 169 mg/dL — ABNORMAL HIGH (ref 65–99)

## 2018-02-16 MED ORDER — METFORMIN HCL 500 MG PO TABS
500.0000 mg | ORAL_TABLET | Freq: Two times a day (BID) | ORAL | Status: DC
  Administered 2018-02-16 – 2018-02-17 (×2): 500 mg via ORAL
  Filled 2018-02-16 (×2): qty 1

## 2018-02-16 MED ORDER — CEPHALEXIN 500 MG PO CAPS
500.0000 mg | ORAL_CAPSULE | Freq: Three times a day (TID) | ORAL | Status: DC
  Administered 2018-02-16 – 2018-02-17 (×3): 500 mg via ORAL
  Filled 2018-02-16 (×4): qty 1

## 2018-02-16 NOTE — Progress Notes (Signed)
Hospital Day # 3, s/p admission for sepsis.  Is 2 weeks postpartum.   Subjective: no complaints, up ad lib, voiding and tolerating PO.  She continues to deny SOB, chest pain.  She is doing well with breast pumping.  Denies pain.  Has been afebrile now for over 24 hours. Notes that her bleeding has changed from dark red to now a yellowish discharge. Denies vaginal itching/burning.   Objective: Temp:  [98 F (36.7 C)-98.9 F (37.2 C)] 98.2 F (36.8 C) (04/03 0821) Pulse Rate:  [77-91] 77 (04/03 0821) Resp:  [18] 18 (04/03 0821) BP: (124-133)/(78-90) 129/86 (04/03 0821) SpO2:  [98 %-100 %] 98 % (04/03 40980821)  Physical Exam:  General: alert and no distress  Lungs: clear to auscultation bilaterally Heart: regular rate and rhythm, S1, S2 normal, no murmur, click, rub or gallop Abdomen: soft, non-tender; bowel sounds normal; no masses,  no organomegaly Pelvis: Lochia: appropriate Extremities: DVT Evaluation: No evidence of DVT seen on physical exam. Negative Homan's sign. No cords or calf tenderness. No significant calf/ankle edema, but mild swelling of feet.    CBC Latest Ref Rng & Units 02/16/2018 02/14/2018 02/13/2018  WBC 3.6 - 11.0 K/uL 7.7 10.9 9.3  Hemoglobin 12.0 - 16.0 g/dL 1.1(B9.2(L) 10.3(L) 8.1(L)  Hematocrit 35.0 - 47.0 % 27.8(L) 31.1(L) 25.5(L)  Platelets 150 - 440 K/uL 218 216 211     Lab Results  Component Value Date   CREATININE 0.62 02/16/2018   BUN 7 02/16/2018   NA 134 (L) 02/16/2018   K 3.9 02/16/2018   CL 102 02/16/2018   CO2 25 02/16/2018    Results for [name], Tkai (MRN [id number]) as of 02/16/2018 12:32  Ref. Range 02/15/2018 06:39 02/15/2018 11:58 02/15/2018 17:10 02/16/2018 06:41 02/16/2018 09:30  Glucose-Capillary Latest Ref Range: 65 - 99 mg/dL 130151 (H) 865194 (H) 784151 (H)  257 (H)   Echocardiogram 02/14/2018: Normal left ventricular function; EF 55-60%  Blood culture: Positive for Citrobacter koseri-no drug resistance, sensitive to cefazolin and  gentamicin    Results for [name], Shawna (MRN [id number]) as of 02/16/2018 22:34  Ref. Range 02/15/2018 06:39 02/15/2018 11:58 02/15/2018 17:10  Glucose-Capillary Latest Ref Range: 65 - 99 mg/dL 132151 (H) 440194 (H) 102151 (H)    Assessment/Plan:  1. Postpartum sepsis, patient has been afebrile now for over 24 hrs (Tmax 101.2 at 2349 on 4/1). Will change from IV antibiotics to PO antibiotics.  Will initiate Keflex 500 mg TID (verified sensitivities). Will continue to monitor today to ensure no further fevers. If patient remains stable, can be discharged on this regimen.  SOB has resolved.  2. H/o pre-eclampsia - initially with labile BPs yesterday, however these have normalized.  3. Postpartum state - continue breast pumping.  Lochia is appropriate.  4. Type II DM, previously on Metformin prior to pregnancy. Will change from Glyburide to Metformin 500 mg BID.  Patient currently not adhering to carb modified hospital diet, her husband has been bringing food from home.  5. Anemia - has been variable secondary to patient's fluid load status, however is stabilizing. Patient notes minimal postpartum bleeding, so no concern for active continued blood loss. Will continue to take iron postpartum.   Dispo: If patient remain stable, can d/c home in the a.m.    Hildred Laserherry, Armani Gawlik, MD [facility]

## 2018-02-17 DIAGNOSIS — E119 Type 2 diabetes mellitus without complications: Secondary | ICD-10-CM

## 2018-02-17 MED ORDER — FLUCONAZOLE 150 MG PO TABS: 150.0000 mg | ORAL_TABLET | Freq: Once | ORAL | 3 refills | Status: AC

## 2018-02-17 MED ORDER — METFORMIN HCL 500 MG PO TABS: 500.0000 mg | ORAL_TABLET | Freq: Two times a day (BID) | ORAL | 3 refills | Status: DC

## 2018-02-17 MED ORDER — CEPHALEXIN 500 MG PO CAPS: 500.0000 mg | ORAL_CAPSULE | Freq: Three times a day (TID) | ORAL | 0 refills | Status: DC

## 2018-02-17 NOTE — Plan of Care (Signed)
Pt alert oriented, up in the room , ambulatory without assistance, No shortness of breath while active, afebrile

## 2018-02-17 NOTE — Discharge Summary (Signed)
Physician Discharge Summary  Patient ID: [name] MRN: [id number] DOB/AGE: 30/05/1986 30 y.o.  Admit date: 02/13/2018 Discharge date: 02/17/2018  Admission Diagnoses:  Sepsis, postpartum endometritis, postpartum 5 days from vacuum assisted vaginal delivery, postpartum anemia, Type II DM, History of Pre-eclampsia   Discharge Diagnoses:  Same as admission diagnosis without endometritis  Discharged Condition: good  Hospital Course: The patient is a 32 y.o. G1P1 who is 5 days postpartum s/p VAVD presented to the Emergency Room with complaints of febrile morbidity. She had been diagnosed 1 day prior with a UTI however had not started her antibiotic course. On further review patient was notnoted to have a UTI, and was instead admitted for postpartum endometritis  She was started on Gentamycin and Clindamycin I V. She had also received a dose of Rocephin in the Emergency Room prior to admission.  On HD#1, patient began to complain of dyspnea and was noted to have +2 pitting edema. She was treated with a dose of 20 mg IV Lasix x 1.  A CXR and CT Chest were performed to rule out postpartum Cardiomyopathy or Pulmonary Embolism,  A Cardiology Consult was placed respectively.  Her antibiotic course was changed to Ancef and Gentamycin.  Her breathing improved several hours after receiving the Lasix. By slightly over 24 hours the patient was afebrile.  She remained on IV antibiotics. She was then changed to PO antibiotics (Keflex 500 mg TID) and monitored until the following day until she was discharged.iabetes was initially managed with Glyburide, however was changed to Metformin on HD#3 foe better glucose control postpartum.   She continurs to rain afebrile bu day of discharge on HD #5 in stable condition.   Consults: cardiology Significant Diagnostic Studies: labs and radiology: CXR: pleural effusion: bilaterally (small), and Echocardiogram: normal cardiac function, EF  65%), and CT/PE protocol was  negative  Results for orders placed or performed during the hospital encounter of 02/13/18  CBC with Differential  Result Value Ref Range   WBC 9.3 3.6 - 11.0 K/uL   RBC 2.89 (L) 3.80 - 5.20 MIL/uL   Hemoglobin 8.1 (L) 12.0 - 16.0 g/dL   HCT 40.925.5 (L) 81.135.0 - 91.447.0 %   MCV 88.1 80.0 - 100.0 fL   MCH 28.2 26.0 - 34.0 pg   MCHC 32.0 32.0 - 36.0 g/dL   RDW 78.215.2 (H) 95.611.5 - 21.314.5 %   Platelets 211 150 - 440 K/uL   Neutrophils Relative % 90 %   Neutro Abs 8.4 (H) 1.4 - 6.5 K/uL   Lymphocytes Relative 8 %   Lymphs Abs 0.8 (L) 1.0 - 3.6 K/uL   Monocytes Relative 2 %   Monocytes Absolute 0.1 (L) 0.2 - 0.9 K/uL   Eosinophils Relative 0 %   Eosinophils Absolute 0.0 0 - 0.7 K/uL   Basophils Relative 0 %   Basophils Absolute 0.0 0 - 0.1 K/uL  Basic metabolic panel  Result Value Ref Range   Sodium 134 (L) 135 - 145 mmol/L   Potassium 4.2 3.5 - 5.1 mmol/L   Chloride 108 101 - 111 mmol/L   CO2 19 (L) 22 - 32 mmol/L   Glucose, Bld 172 (H) 65 - 99 mg/dL   BUN 10 6 - 20 mg/dL   Creatinine, Ser 0.860.76 0.44 - 1.00 mg/dL   Calcium 7.5 (L) 8.9 - 10.3 mg/dL   GFR calc non Af Amer >60 >60 mL/min   GFR calc Af Amer >60 >60 mL/min   Anion gap 7 5 - 15  Lactic acid, plasma  Result Value Ref Range   Lactic Acid, Venous 2.1 (HH) 0.5 - 1.9 mmol/L  Lactic acid, plasma  Result Value Ref Range   Lactic Acid, Venous 2.3 (HH) 0.5 - 1.9 mmol/L  CBC  Result Value Ref Range   WBC 10.9 3.6 - 11.0 K/uL   RBC 3.45 (L) 3.80 - 5.20 MIL/uL   Hemoglobin 10.3 (L) 12.0 - 16.0 g/dL   HCT 09.8 (L) 11.9 - 14.7 %   MCV 90.2 80.0 - 100.0 fL   MCH 30.0 26.0 - 34.0 pg   MCHC 33.2 32.0 - 36.0 g/dL   RDW 82.9 (H) 56.2 - 13.0 %   Platelets 216 150 - 440 K/uL  Comprehensive metabolic panel  Result Value Ref Range   Sodium 138 135 - 145 mmol/L   Potassium 3.8 3.5 - 5.1 mmol/L   Chloride 109 101 - 111 mmol/L   CO2 20 (L) 22 - 32 mmol/L   Glucose, Bld 196 (H) 65 - 99 mg/dL   BUN 9 6 - 20 mg/dL   Creatinine, Ser 8.65 0.44  - 1.00 mg/dL   Calcium 8.6 (L) 8.9 - 10.3 mg/dL   Total Protein 6.8 6.5 - 8.1 g/dL   Albumin 2.4 (L) 3.5 - 5.0 g/dL   AST 47 (H) 15 - 41 U/L   ALT 41 14 - 54 U/L   Alkaline Phosphatase 166 (H) 38 - 126 U/L   Total Bilirubin 0.7 0.3 - 1.2 mg/dL   GFR calc non Af Amer >60 >60 mL/min   GFR calc Af Amer >60 >60 mL/min   Anion gap 9 5 - 15  Protein / creatinine ratio, urine  Result Value Ref Range   Creatinine, Urine <10 mg/dL   Total Protein, Urine 40 mg/dL   Protein Creatinine Ratio        0.00 - 0.15 mg/mg[Cre]  Glucose, capillary  Result Value Ref Range   Glucose-Capillary 234 (H) 65 - 99 mg/dL   Comment 1 Notify RN   Glucose, capillary  Result Value Ref Range   Glucose-Capillary 110 (H) 65 - 99 mg/dL   Comment 1 Notify RN   Glucose, capillary  Result Value Ref Range   Glucose-Capillary 151 (H) 65 - 99 mg/dL  Glucose, capillary  Result Value Ref Range   Glucose-Capillary 194 (H) 65 - 99 mg/dL  CBC with Differential/Platelet  Result Value Ref Range   WBC 7.7 3.6 - 11.0 K/uL   RBC 3.19 (L) 3.80 - 5.20 MIL/uL   Hemoglobin 9.2 (L) 12.0 - 16.0 g/dL   HCT 78.4 (L) 69.6 - 29.5 %   MCV 87.1 80.0 - 100.0 fL   MCH 29.0 26.0 - 34.0 pg   MCHC 33.3 32.0 - 36.0 g/dL   RDW 28.4 (H) 13.2 - 44.0 %   Platelets 218 150 - 440 K/uL   Neutrophils Relative % 61 %   Neutro Abs 4.7 1.4 - 6.5 K/uL   Lymphocytes Relative 31 %   Lymphs Abs 2.3 1.0 - 3.6 K/uL   Monocytes Relative 6 %   Monocytes Absolute 0.4 0.2 - 0.9 K/uL   Eosinophils Relative 3 %   Eosinophils Absolute 0.2 0 - 0.7 K/uL   Basophils Relative 1 %   Basophils Absolute 0.0 0 - 0.1 K/uL  Comprehensive metabolic panel  Result Value Ref Range   Sodium 134 (L) 135 - 145 mmol/L   Potassium 3.9 3.5 - 5.1 mmol/L   Chloride 102 101 - 111 mmol/L  CO2 25 22 - 32 mmol/L   Glucose, Bld 166 (H) 65 - 99 mg/dL   BUN 7 6 - 20 mg/dL   Creatinine, Ser 1.61 0.44 - 1.00 mg/dL   Calcium 9.0 8.9 - 09.6 mg/dL   Total Protein 6.3 (L) 6.5 - 8.1  g/dL   Albumin 2.2 (L) 3.5 - 5.0 g/dL   AST 19 15 - 41 U/L   ALT 22 14 - 54 U/L   Alkaline Phosphatase 125 38 - 126 U/L   Total Bilirubin 0.4 0.3 - 1.2 mg/dL   GFR calc non Af Amer >60 >60 mL/min   GFR calc Af Amer >60 >60 mL/min   Anion gap 7 5 - 15  Glucose, capillary  Result Value Ref Range   Glucose-Capillary 151 (H) 65 - 99 mg/dL  Glucose, capillary  Result Value Ref Range   Glucose-Capillary 257 (H) 65 - 99 mg/dL   Comment 1 Notify RN   Glucose, capillary  Result Value Ref Range   Glucose-Capillary 169 (H) 65 - 99 mg/dL   Comment 1 Notify RN   Glucose, capillary  Result Value Ref Range   Glucose-Capillary 173 (H) 65 - 99 mg/dL  ECHOCARDIOGRAM COMPLETE  Result Value Ref Range   Weight 2,848 oz   Height 63 in   BP 127/78 mmHg  Type and screen [facility]  Result Value Ref Range   ABO/RH(D) A POS    Antibody Screen NEG    Sample Expiration      02/16/2018 Performed at [facility], [address], [place], [place] [address]    US PELVIS (TRANSABDOMINAL ONLY) CLINICAL DATA:  Postpartum vaginal bleeding  EXAM: TRANSABDOMINAL ULTRASOUND OF PELVIS  TECHNIQUE: Transabdominal ultrasound examination of the pelvis was performed including evaluation of the uterus, ovaries, adnexal regions, and pelvic cul-de-sac.  COMPARISON:  None.  FINDINGS: Uterus  Measurements: 13.7 x 6.2 x 8.7 cm. No fibroids or other mass visualized.  Endometrium  Thickness: 2.5 cm at the fundus. 4.9 cm at the lower uterine segment/cervix. Heterogeneous appearance of the endometrium.  Right ovary  Not seen  Left ovary  Measurements: 2.9 x 3.6 x 2.8 cm. Normal appearance/no adnexal mass.  Other findings:  No abnormal free fluid.  IMPRESSION: 1. Heterogenous endometrial thickening measuring up to 2.5 cm in the fundal region, without significant increased vascularity; findings are nonspecific and could indicate endometrial blood products  versus hypovascular retained products of conception. Larger heterogenous collection within the lower uterine segment and cervix measuring up to 4.9 cm, possible blood products/hematoma. 2. Nonvisualized right ovary  Electronically Signed   By: Jasmine Pang M.D.   On: 02/23/2018 18:10    Treatments: IV hydration, antibiotics: Ancef and gentamycin and Lasix  Discharge Exam: Blood pressure 136/83, pulse 77, temperature 98.4 F (36.9 C), temperature source Oral, resp. rate 20, height 5\' 3"  (1.6 m), weight 177 lb (80.3 kg), SpO2 100 %, currently breastfeeding. General appearance: alert, cooperative and no distress Resp: clear to auscultation bilaterally Cardio: regular rate and rhythm, S1, S2 normal, no murmur, click, rub or gallop GI: soft, non-tender; bowel sounds normal; no masses,  no organomegaly Pelvic: deferred Extremities: edema trace and Homans sign is negative, no sign of DVT Skin: Skin color, texture, turgor normal. No rashes or lesions Neurologic: Grossly normal  Disposition: Home   Allergies as of 02/17/2018   No Known Allergies     Medication List    STOP taking these medications   cephALEXin 500 MG capsule  Commonly known as:  KEFLEX   ibuprofen 200 MG tablet Commonly known as:  ADVIL,MOTRIN     TAKE these medications   conjugated estrogens vaginal cream Commonly known as:  PREMARIN 1/3 applicator in vagina before bedtime daily as directed.   docusate sodium 100 MG capsule Commonly known as:  COLACE Take 100 mg by mouth 2 (two) times daily.   metFORMIN 500 MG tablet Commonly known as:  GLUCOPHAGE Take 1 tablet (500 mg total) by mouth 2 (two) times daily with a meal.   Prenatal Vitamin 27-0.8 MG Tabs Take 1 tablet by mouth daily.   psyllium 58.6 % powder Commonly known as:  METAMUCIL Take 1 packet by mouth daily as needed.     ASK your doctor about these medications   fluconazole 150 MG tablet Commonly known as:  DIFLUCAN Take 1 tablet (150 mg  total) by mouth once for 1 dose. Can take additional dose three days later if symptoms persist Ask about: Should I take this medication?        Signed: Hildred Laser, MD 02/17/2018, 7:59 AM

## 2018-02-17 NOTE — Progress Notes (Signed)
Pt discharged home.  Discharge instructions, prescriptions and follow up appointment given to and reviewed with pt.  Pt verbalized understanding.  Escorted by auxillary. 

## 2018-02-17 NOTE — Discharge Instructions (Signed)
Sepsis, Adult Sepsis is a serious bodily reaction to an infection. The infection that causes sepsis may be from a bacteria, a virus, a fungus, or a parasite. Sepsis can result from an infection in any part of the body. Infections that commonly lead to sepsis include skin, lung, and urinary tract infections. Sepsis is a medical emergency that requires immediate treatment at the hospital. In severe cases, it can lead to septic shock. Shock can weaken the heart and cause blood pressure to drop. This can make the central nervous system and the body's organs to stop working. What are the causes? This condition is caused by a severe reaction to a bacterial, viral, fungal, or parasitic infection. The germs that most commonly lead to sepsis include:  Escherichia coli (E. coli).  Staphylococcus aureus (staph).  The most common infections that lead to sepsis include infections of:  The skin.  The lung (pneumonia).  The gut.  The kidneys (urinary tract infection).  What increases the risk? You are more likely to develop this condition if:  You have a weakened disease-fighting (immune) system.  You are 65 or older.  You are female.  You had surgery, or you have been hospitalized.  You have a catheter, breathing tube, or drainage tubes inserted into your body.  You are not getting enough nutrients from food (are malnourished).  You have other long-term (chronic) diseases, including: ? Cancer. ? AIDS. ? Liver disease. ? Lung disease. ? Diabetes.  You have severe burns or injuries.  You inject drugs.  You have heart valve problems.  What are the signs or symptoms? Symptoms of this condition may include:  Fever.  Chills or feeling very cold.  Fast heart rate (tachycardia).  Rapid breathing (hyperventilation).  Shortness of breath.  Confusion or light-headedness.  Changes in skin color. Your skin may look blotchy, pale, or blue.  Cool, clammy skin or sweaty skin.  Skin  rash.  Nausea and vomiting.  Urinating much less than usual.  How is this diagnosed? This condition is diagnosed based on:  Your symptoms.  Your medical history.  A physical exam.  Other tests may also be done to find out the cause of the infection and how severe the sepsis is. These tests may include:  Blood tests.  Urine tests.  Swabs from other areas of the body that may have an infection. These samples may be tested (cultured) to find out what type of bacteria is causing the infection.  Chest X-ray to check for pneumonia. Other imaging tests, such as a CT scan, may also be done.  Lumbar puncture. This is a procedure to remove a small amount of the fluid that surrounds the brain and spinal cord. The fluid is then examined for infection.  How is this treated? This condition is treated in a hospital with antibiotic medicines. You may also receive:  Fluids through an IV tube.  Oxygen and breathing assistance.  Kidney dialysis. This process cleans the blood if the kidneys have failed.  Surgery to remove infected tissue.  Medicines to increase your blood pressure.  Nutrients to correct imbalances in basic body function (metabolism). This may involve receiving important salts and minerals (electrolytes) through an IV and having your blood sugar level adjusted.  Steroid medicines to control your body's reaction to the infection.  Follow these instructions at home: Medicines  Take over-the-counter and prescription medicines only as told by your health care provider.  If you were prescribed an antibiotic or anti-fungal medicine, take it   as told by your health care provider. Do not stop taking the antibiotic or anti-fungal medicine even if you start to feel better. Activity  Rest and gradually return to your normal activities. Ask your health care provider what activities are safe for you.  Try to set small, achievable goals each week, such as dressing yourself,  bathing, or walking up stairs. It may take a while to rebuild your strength.  Try to exercise regularly, if you feel healthy enough to do so. Ask your health care provider what exercises are safe for you. General instructions  Drink enough fluid to keep your urine clear or pale yellow.  Eat a healthy, balanced diet. This includes plenty of fruits and vegetables, whole grains, and lowfat (lean) proteins. Ask your health care provider if you should avoid certain foods.  Keep all follow-up visits as told by your health care provider. This is important. Contact a health care provider if:  You do not feel like you are getting better or regaining strength.  You are having trouble coping with your recovery.  You frequently feel tired.  You feel worse or do not seem to get better after surgery.  You think you may have an infection after surgery. Get help right away if:  You have any symptoms of sepsis.  You have difficulty breathing.  You have a rapid or skipping heartbeat.  You become confused.  You have a high fever.  Your skin becomes blotchy, pale, or blue. These symptoms may represent a serious problem that is an emergency. Do not wait to see if the symptoms will go away. Get medical help right away. Call your local emergency services (911 in the [place] Summary  Sepsis is a medical emergency that requires immediate treatment at the hospital.  This condition is caused by a severe reaction to a bacterial, viral, fungal, or parasitic infection.  This condition is treated in a hospital with antibiotics. Treatment may also include IV fluids, breathing assistance, and kidney dialysis.  If you were prescribed an antibiotic or anti-fungal medicine, take it as told by your health care provider. Do not stop taking the antibiotic or anti-fungal medicine even if you start to feel better. This information is not intended to replace advice given to you by your health care provider. Make  sure you discuss any questions you have with your health care provider. Document Released: 08/01/2003 Document Revised: 10/06/2016 Document Reviewed: 10/06/2016 Elsevier Interactive Patient Education  2018 [employer]  

## 2018-02-17 NOTE — Progress Notes (Signed)
Morning CBG not performed this AM due to patient eating breakfast without informing RN. However discharge orders are in and patient being discharge this AM.   Oswald HillockAbigail Garner, RN

## 2018-02-17 NOTE — Progress Notes (Signed)
Hospital Day # 4, s/p admission for sepsis.  Is 2 weeks postpartum.   Subjective: no complaints, up ad lib, voiding and tolerating PO.  She continues to deny SOB, chest pain.  She is doing well with breast pumping.  Denies pain.    Objective: Temp:  [97.8 F (36.6 C)-98.4 F (36.9 C)] 98.4 F (36.9 C) (04/04 0322) Pulse Rate:  [77-102] 77 (04/04 0322) Resp:  [18-20] 20 (04/04 0322) BP: (119-140)/(75-97) 136/83 (04/04 0322) SpO2:  [98 %-100 %] 100 % (04/04 0322) Weight:  [177 lb (80.3 kg)] 177 lb (80.3 kg) (04/03 0934)  Physical Exam:  General: alert and no distress  Lungs: clear to auscultation bilaterally Heart: regular rate and rhythm, S1, S2 normal, no murmur, click, rub or gallop Abdomen: soft, non-tender; bowel sounds normal; no masses,  no organomegaly Pelvis: Lochia: appropriate Extremities: DVT Evaluation: No evidence of DVT seen on physical exam. Negative Homan's sign. No cords or calf tenderness. No significant calf/ankle edema    CBC Latest Ref Rng & Units 02/16/2018 02/14/2018 02/13/2018  WBC 3.6 - 11.0 K/uL 7.7 10.9 9.3  Hemoglobin 12.0 - 16.0 g/dL 4.6(N9.2(L) 10.3(L) 8.1(L)  Hematocrit 35.0 - 47.0 % 27.8(L) 31.1(L) 25.5(L)  Platelets 150 - 440 K/uL 218 216 211     Lab Results  Component Value Date   CREATININE 0.62 02/16/2018   BUN 7 02/16/2018   NA 134 (L) 02/16/2018   K 3.9 02/16/2018   CL 102 02/16/2018   CO2 25 02/16/2018    Results for Heather CurtMURALIDHARAN, [name] as of 02/16/2018 12:32  Ref. Range 02/15/2018 06:39 02/15/2018 11:58 02/15/2018 17:10 02/16/2018 06:41 02/16/2018 09:30  Glucose-Capillary Latest Ref Range: 65 - 99 mg/dL 244151 (H) 010194 (H) 272151 (H)  257 (H)   Echocardiogram 02/14/2018: Normal left ventricular function; EF 55-60%  Blood culture: Positive for Citrobacter koseri-no drug resistance, sensitive to cefazolin and gentamicin   Results for Heather CurtMURALIDHARAN, [name] as of 02/17/2018 07:55  Ref. Range 02/16/2018 09:30 02/16/2018 12:09 02/16/2018  17:11  Glucose-Capillary Latest Ref Range: 65 - 99 mg/dL 742257 (H) 595169 (H) 638173 (H)   Pending fasting glucose this a.m.   Assessment/Plan:  1. Postpartum sepsis, patient has been afebrile now for over 48 hrs (Tmax 101.2 at 2349 on 4/1). Continue Keflex 500 mg TID (verified sensitivities).  SOB has resolved.  2. H/o pre-eclampsia - with labile BPs but none in severe range. Does not require treatment at this time.   3. Postpartum state - continue breast pumping.  Lochia is appropriate.  4. Type II DM, previously on Metformin prior to pregnancy. Changed from Glyburide to Metformin 500 mg BID yesterday evening.  Patient currently not adhering to carb modified hospital diet, her husband has been bringing food from home. Will advise on better compliance. 5. Anemia - has been variable secondary to patient's fluid load status, however is stabilizing.Will continue to take iron postpartum.   Dispo: Home today.   Hildred Laserherry, Gilman Olazabal, MD [facility]

## 2018-02-18 ENCOUNTER — Other Ambulatory Visit: Payer: 59

## 2018-02-19 ENCOUNTER — Emergency Department
Admission: EM | Admit: 2018-02-19 | Discharge: 2018-02-20 | Disposition: A | Discharge status: Home / Self Care | Payer: 59 | Attending: Emergency Medicine | Admitting: Emergency Medicine

## 2018-02-19 ENCOUNTER — Emergency Department: Payer: 59

## 2018-02-19 ENCOUNTER — Encounter: Payer: Self-pay | Admitting: Emergency Medicine

## 2018-02-19 DIAGNOSIS — E119 Type 2 diabetes mellitus without complications: Secondary | ICD-10-CM | POA: Diagnosis not present

## 2018-02-19 DIAGNOSIS — Z7984 Long term (current) use of oral hypoglycemic drugs: Secondary | ICD-10-CM | POA: Diagnosis not present

## 2018-02-19 DIAGNOSIS — R509 Fever, unspecified: Secondary | ICD-10-CM | POA: Diagnosis not present

## 2018-02-19 DIAGNOSIS — Z79899 Other long term (current) drug therapy: Secondary | ICD-10-CM | POA: Insufficient documentation

## 2018-02-19 DIAGNOSIS — I10 Essential (primary) hypertension: Secondary | ICD-10-CM | POA: Diagnosis not present

## 2018-02-19 LAB — CBC WITH DIFFERENTIAL/PLATELET
BASOS ABS: 0 10*3/uL (ref 0–0.1)
Basophils Relative: 0 %
EOS ABS: 0.1 10*3/uL (ref 0–0.7)
Eosinophils Relative: 1 %
HCT: 29.3 % — ABNORMAL LOW (ref 35.0–47.0)
HEMOGLOBIN: 9.7 g/dL — AB (ref 12.0–16.0)
Lymphocytes Relative: 11 %
Lymphs Abs: 1.7 10*3/uL (ref 1.0–3.6)
MCH: 28.5 pg (ref 26.0–34.0)
MCHC: 33 g/dL (ref 32.0–36.0)
MCV: 86.3 fL (ref 80.0–100.0)
Monocytes Absolute: 0.7 10*3/uL (ref 0.2–0.9)
Monocytes Relative: 5 %
NEUTROS PCT: 83 %
Neutro Abs: 12.1 10*3/uL — ABNORMAL HIGH (ref 1.4–6.5)
Platelets: 380 10*3/uL (ref 150–440)
RBC: 3.4 MIL/uL — AB (ref 3.80–5.20)
RDW: 15.4 % — ABNORMAL HIGH (ref 11.5–14.5)
WBC: 14.6 10*3/uL — AB (ref 3.6–11.0)

## 2018-02-19 LAB — COMPREHENSIVE METABOLIC PANEL
ALK PHOS: 146 U/L — AB (ref 38–126)
ALT: 15 U/L (ref 14–54)
ANION GAP: 9 (ref 5–15)
AST: 19 U/L (ref 15–41)
Albumin: 2.7 g/dL — ABNORMAL LOW (ref 3.5–5.0)
BUN: 8 mg/dL (ref 6–20)
CALCIUM: 8.5 mg/dL — AB (ref 8.9–10.3)
CO2: 23 mmol/L (ref 22–32)
Chloride: 97 mmol/L — ABNORMAL LOW (ref 101–111)
Creatinine, Ser: 0.72 mg/dL (ref 0.44–1.00)
GFR calc non Af Amer: >60 mL/min (ref >60)
Glucose, Bld: 145 mg/dL — ABNORMAL HIGH (ref 65–99)
Potassium: 3.7 mmol/L (ref 3.5–5.1)
SODIUM: 129 mmol/L — AB (ref 135–145)
Total Bilirubin: 0.6 mg/dL (ref 0.3–1.2)
Total Protein: 6.9 g/dL (ref 6.5–8.1)

## 2018-02-19 LAB — URINALYSIS, COMPLETE (UACMP) WITH MICROSCOPIC
Bilirubin Urine: NEGATIVE
GLUCOSE, UA: NEGATIVE mg/dL
Ketones, ur: NEGATIVE mg/dL
Nitrite: NEGATIVE
PROTEIN: 30 mg/dL — AB
Specific Gravity, Urine: 1.006 (ref 1.005–1.030)
pH: 6 (ref 5.0–8.0)

## 2018-02-19 LAB — PROTIME-INR
INR: 0.96
Prothrombin Time: 12.7 seconds (ref 11.4–15.2)

## 2018-02-19 LAB — LACTIC ACID, PLASMA: Lactic Acid, Venous: 1.4 mmol/L (ref 0.5–1.9)

## 2018-02-19 MED ORDER — AMOXICILLIN-POT CLAVULANATE 875-125 MG PO TABS: 1.0000 | ORAL_TABLET | Freq: Two times a day (BID) | ORAL | 0 refills | Status: DC

## 2018-02-19 MED ORDER — LIDOCAINE HCL (PF) 1 % IJ SOLN
INTRAMUSCULAR | Status: AC
  Filled 2018-02-19: qty 5

## 2018-02-19 MED ORDER — ACETAMINOPHEN 325 MG PO TABS: 650.0000 mg | ORAL_TABLET | Freq: Once | ORAL | Status: DC | PRN

## 2018-02-19 MED ORDER — ACETAMINOPHEN 500 MG PO TABS
1000.0000 mg | ORAL_TABLET | Freq: Once | ORAL | Status: AC
  Administered 2018-02-19: 1000 mg via ORAL
  Filled 2018-02-19: qty 2

## 2018-02-19 MED ORDER — SODIUM CHLORIDE 0.9 % IV BOLUS
1000.0000 mL | Freq: Once | INTRAVENOUS | Status: AC
Start: 2018-02-19 — End: 2018-02-19
  Administered 2018-02-19: 1000 mL via INTRAVENOUS

## 2018-02-19 NOTE — ED Notes (Signed)
Patient c/o abscess on right inguinal fold. Dr. Derrill KayGoodman aware.

## 2018-02-19 NOTE — Discharge Instructions (Addendum)
Please seek medical attention for any high fevers, chest pain, shortness of breath, change in behavior, persistent vomiting, bloody stool or any other new or concerning symptoms.  

## 2018-02-19 NOTE — ED Notes (Signed)
Patient taken to xray.

## 2018-02-19 NOTE — ED Triage Notes (Signed)
Pt states she was treated last week and admitted for sepsis.  Patient is taking Keflex and has 4 more days to take it.  Patient has 101.6 temp in triage and 126P.  Pt denies pains and SOB.

## 2018-02-20 NOTE — ED Provider Notes (Signed)
[facility] Emergency Department Provider Note   ____________________________________________   I have reviewed the triage vital signs and the nursing notes.   HISTORY  Chief Complaint Fever   History limited by: Not Limited   HPI [name] is a 32 y.o. female who presents to the emergency department today because of recurrent fever and tachycardia.  The patient had been discharged from the hospital 2 days previously for sepsis.  At the time of discharge the patient had been afebrile.  She states that yesterday she did not have any fevers.  She felt good and had good energy.  Today however she started feeling hot again.  Measured her temperature at home and got temperature of 103.  She had associated tachycardia.  She denies any chest pain or shortness of breath.  She denies any nausea or vomiting.  No change in urination.    Per medical record review patient has a history of recent admission for sepsis. Was prescribed keflex.  Past Medical History:  Diagnosis Date  . Diabetes mellitus without complication (HCC)   . Hormone replacement therapy   . Hypertension    during pregnancy  . Premature ovarian failure     Patient Active Problem List   Diagnosis Date Noted  . History of pre-eclampsia 02/16/2018  . Sepsis (HCC) 02/15/2018  . Postpartum endometritis 02/13/2018  . Labor and delivery indication for care or intervention 01/31/2018  . PIH (pregnancy induced hypertension) 01/31/2018  . Pregnancy 01/29/2018  . Proteinuria affecting pregnancy, antepartum 01/28/2018  . Elevated blood pressure affecting pregnancy, antepartum 01/28/2018  . Preeclampsia, third trimester 01/28/2018  . 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 Pregnancy resulting from in vitro fertilization 09/17/2017  . Type 2 diabetes mellitus in pregnancy, third trimester 09/17/2017  . Obesity (BMI 30.0-34.9) 08/20/2017  . Type 2 diabetes mellitus without complication,  without long-term current use of insulin (HCC) 11/26/2016  . 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 Infertility associated with anovulation 11/26/2016    Past Surgical History:  Procedure Laterality Date  . APPENDECTOMY      Prior to Admission medications   Medication Sig Start Date End Date Taking? Authorizing Provider  cephALEXin (KEFLEX) 500 MG capsule Take 1 capsule (500 mg total) by mouth 3 (three) times daily. 02/17/18   Hildred Laserherry, Anika, MD  conjugated estrogens (PREMARIN) vaginal cream 1/3 applicator in vagina before bedtime daily as directed. 02/04/18   Linzie CollinEvans, David James, MD  DHA-EPA-VITAMIN E PO     [provider]  docusate sodium (COLACE) 100 MG capsule Take 100 mg by mouth 2 (two) times daily.    [provider]  metFORMIN (GLUCOPHAGE) 500 MG tablet Take 1 tablet (500 mg total) by mouth 2 (two) times daily with a meal. 02/17/18   Hildred Laserherry, Anika, MD  Prenatal Vit-Fe Fumarate-FA (PRENATAL VITAMIN) 27-0.8 MG TABS Take 1 tablet by mouth daily.     [provider]  Probiotic Product (PROBIOTIC ADVANCED PO) Take 1 capsule by mouth daily.  04/16/16   [provider]  psyllium (METAMUCIL) 58.6 % powder Take 1 packet by mouth daily as needed.     [provider]    Allergies Patient has no known allergies.  Family History  Problem Relation Age of Onset  . Diabetes Mother   . Hypertension Mother     Social History Social History   Tobacco Use  . Smoking status: Never Smoker  . Smokeless tobacco: Never Used  Substance Use Topics  . Alcohol use: No  .  Drug use: No    Review of Systems Constitutional: Positive for fevers. Eyes: No visual changes. ENT: No sore throat. Cardiovascular: Positive for tachycardia.  Respiratory: Denies shortness of breath. Gastrointestinal: No abdominal pain.  No nausea, no vomiting.  No diarrhea.   Genitourinary: Negative for dysuria. Musculoskeletal: Negative for back pain. Skin: Negative for  rash. Neurological: Negative for headaches, focal weakness or numbness.  ____________________________________________   PHYSICAL EXAM:  VITAL SIGNS: ED Triage Vitals [02/19/18 1945]  Enc Vitals Group     BP 119/68     Pulse Rate (!) 126     Resp 20     Temp (!) 101.6 F (38.7 C)     Temp Source Oral     SpO2 95 %     Weight      Height      Head Circumference      Peak Flow      Pain Score 0   Constitutional: Alert and oriented. Well appearing and in no distress. Eyes: Conjunctivae are normal.  ENT   Head: Normocephalic and atraumatic.   Nose: No congestion/rhinnorhea.   Mouth/Throat: Mucous membranes are moist.   Neck: No stridor. Hematological/Lymphatic/Immunilogical: No cervical lymphadenopathy. Cardiovascular: Tachycardic, regular rhythm.  No murmurs, rubs, or gallops. Respiratory: Normal respiratory effort without tachypnea nor retractions. Breath sounds are clear and equal bilaterally. No wheezes/rales/rhonchi. Gastrointestinal: Soft and non tender. No rebound. No guarding.  Genitourinary: Deferred Musculoskeletal: Normal range of motion in all extremities. No lower extremity edema. Neurologic:  Normal speech and language. No gross focal neurologic deficits are appreciated.  Skin:  Area of induration and fluctuance to right groin. Psychiatric: Mood and affect are normal. Speech and behavior are normal. Patient exhibits appropriate insight and judgment.  ____________________________________________    LABS (pertinent positives/negatives)  UA concerning for continued UTI CBC wbc 14.6, hgb 9.7, plt 380 CMP na 129, cl 97, glu 145, cr 0.72 Lactic 1.4 ____________________________________________   EKG  None  ____________________________________________    RADIOLOGY  CXR Resolution of pulmonary infiltrates  ____________________________________________   PROCEDURES  Procedures  Incision and Drainage of Abcess Location: right  groin Anesthesia Local: 1% Lidocaine   Prep/Procedure: Skin Prep: Chlorahex Incised abscess with #11 blade Purulent discharge: large Probed to break up loculations Packed with 1/4" gauze Estimated blood loss: 2 ml  ____________________________________________   INITIAL IMPRESSION / ASSESSMENT AND PLAN / ED COURSE  Pertinent labs & imaging results that were available during my care of the patient were reviewed by me and considered in my medical decision making (see chart for details).  Patient presented to the emergency department today because of concerns for recurrent fever.  Patient recently had admission for sepsis.  Differential would be broad including pneumonia, urinary tract infection, viral illness amongst other etiologies.  After the initial exam patient did state that she also has noticed an area of pain and raise skin to her right groin.  On exam of this area it is consistent with an abscess.  This was incised and drained with a large amount of purulent drainage.  Do think this likely would explain the recurrent fevers.  Patient is currently on Keflex and is breast-feeding.  This point will not broaden antibiotic coverage now that abscess has been incised.  Will have patient follow-up with OB/GYN. Discussed findings and plan with patient and family. Discussed with patient/family results of testing/physical exam, differential plan and return precautions.  ____________________________________________   FINAL CLINICAL IMPRESSION(S) / ED DIAGNOSES  Final diagnoses:  Fever, unspecified fever cause     Note: This dictation was prepared with Dragon dictation. Any transcriptional errors that result from this process are unintentional     Phineas Semen, MD 02/20/18 [phone]

## 2018-02-22 ENCOUNTER — Ambulatory Visit (INDEPENDENT_AMBULATORY_CARE_PROVIDER_SITE_OTHER): Payer: 59 | Admitting: Family Medicine

## 2018-02-22 ENCOUNTER — Other Ambulatory Visit
Admission: RE | Admit: 2018-02-22 | Discharge: 2018-02-22 | Disposition: A | Discharge status: Home / Self Care | Payer: 59 | Source: Ambulatory Visit | Attending: Family Medicine | Admitting: Family Medicine

## 2018-02-22 ENCOUNTER — Encounter: Payer: Self-pay | Admitting: Family Medicine

## 2018-02-22 VITALS — BP 110/80 | HR 68 | Resp 12 | Ht 63.0 in | Wt 168.0 lb

## 2018-02-22 DIAGNOSIS — A419 Sepsis, unspecified organism: Secondary | ICD-10-CM

## 2018-02-22 DIAGNOSIS — Z09 Encounter for follow-up examination after completed treatment for conditions other than malignant neoplasm: Secondary | ICD-10-CM | POA: Diagnosis not present

## 2018-02-22 DIAGNOSIS — R5383 Other fatigue: Secondary | ICD-10-CM

## 2018-02-22 LAB — COMPREHENSIVE METABOLIC PANEL
ALK PHOS: 127 U/L — AB (ref 38–126)
ALT: 17 U/L (ref 14–54)
ANION GAP: 10 (ref 5–15)
AST: 23 U/L (ref 15–41)
Albumin: 2.7 g/dL — ABNORMAL LOW (ref 3.5–5.0)
BILIRUBIN TOTAL: 0.1 mg/dL — AB (ref 0.3–1.2)
BUN: 7 mg/dL (ref 6–20)
CALCIUM: 8.5 mg/dL — AB (ref 8.9–10.3)
CO2: 25 mmol/L (ref 22–32)
Chloride: 96 mmol/L — ABNORMAL LOW (ref 101–111)
Creatinine, Ser: 0.7 mg/dL (ref 0.44–1.00)
GFR calc Af Amer: >60 mL/min (ref >60)
GFR calc non Af Amer: >60 mL/min (ref >60)
Glucose, Bld: 188 mg/dL — ABNORMAL HIGH (ref 65–99)
Potassium: 4.2 mmol/L (ref 3.5–5.1)
SODIUM: 131 mmol/L — AB (ref 135–145)
TOTAL PROTEIN: 6.9 g/dL (ref 6.5–8.1)

## 2018-02-22 LAB — CBC
HEMATOCRIT: 27.6 % — AB (ref 35.0–47.0)
HEMOGLOBIN: 9.1 g/dL — AB (ref 12.0–16.0)
MCH: 28.6 pg (ref 26.0–34.0)
MCHC: 33 g/dL (ref 32.0–36.0)
MCV: 86.5 fL (ref 80.0–100.0)
Platelets: 515 10*3/uL — ABNORMAL HIGH (ref 150–440)
RBC: 3.19 MIL/uL — ABNORMAL LOW (ref 3.80–5.20)
RDW: 15.3 % — AB (ref 11.5–14.5)
WBC: 13.8 10*3/uL — ABNORMAL HIGH (ref 3.6–11.0)

## 2018-02-22 LAB — URINE CULTURE: Culture: 80000 — AB

## 2018-02-22 LAB — LACTIC ACID, PLASMA: Lactic Acid, Venous: 2.1 mmol/L (ref 0.5–1.9)

## 2018-02-22 NOTE — Progress Notes (Signed)
Name: [name]   MRN: [id number]    DOB: 04/22/1986   Date:02/22/2018       Progress Note  Subjective  Chief Complaint  Chief Complaint  Patient presents with  . Follow-up    ED visit for abscess opened and drained. Has packing in and is supposed to follow up with Dr Valentino Saxonherry tomorrow.    Pt was recently [facility] on 02/13/18 and was discharged on 02/17/18. Transition of care call placed on seen in er on 02/19/18. Patint was admited for fever and suspected sepsis. Patient was seen in er for I&D of abscess (patient was on cephalexin for Dr Valentino Saxonherry ).   No problem-specific Assessment & Plan notes found for this encounter.   Past Medical History:  Diagnosis Date  . Diabetes mellitus without complication (HCC)   . Hormone replacement therapy   . Hypertension    during pregnancy  . Premature ovarian failure     Past Surgical History:  Procedure Laterality Date  . APPENDECTOMY      Family History  Problem Relation Age of Onset  . Diabetes Mother   . Hypertension Mother     Social History   Socioeconomic History  . Marital status: Married    Spouse name: Not on file  . Number of children: Not on file  . Years of education: Not on file  . Highest education level: Not on file  Occupational History  . Not on file  Social Needs  . Financial resource strain: Not on file  . Food insecurity:    Worry: Not on file    Inability: Not on file  . Transportation needs:    Medical: Not on file    Non-medical: Not on file  Tobacco Use  . Smoking status: Never Smoker  . Smokeless tobacco: Never Used  Substance and Sexual Activity  . Alcohol use: No  . Drug use: No  . Sexual activity: Yes    Birth control/protection: None    Comment: Trying to conceive   Lifestyle  . Physical activity:    Days per week: Not on file    Minutes per session: Not on file  . Stress: Not on file  Relationships  . Social connections:    Talks on phone:  Not on file    Gets together: Not on file    Attends religious service: Not on file    Active member of club or organization: Not on file    Attends meetings of clubs or organizations: Not on file    Relationship status: Not on file  . Intimate partner violence:    Fear of current or ex partner: Not on file    Emotionally abused: Not on file    Physically abused: Not on file    Forced sexual activity: Not on file  Other Topics Concern  . Not on file  Social History Narrative  . Not on file    No Known Allergies  Outpatient Medications Prior to Visit  Medication Sig Dispense Refill  . cephALEXin (KEFLEX) 500 MG capsule Take 1 capsule (500 mg total) by mouth 3 (three) times daily. 18 capsule 0  . conjugated estrogens (PREMARIN) vaginal cream 1/3 applicator in vagina before bedtime daily as directed. 60 g 1  . DHA-EPA-VITAMIN E PO     . docusate sodium (COLACE) 100 MG capsule Take 100 mg by mouth 2 (two) times daily.    . metFORMIN (GLUCOPHAGE) 500 MG tablet Take  1 tablet (500 mg total) by mouth 2 (two) times daily with a meal. 60 tablet 3  . Prenatal Vit-Fe Fumarate-FA (PRENATAL VITAMIN) 27-0.8 MG TABS Take 1 tablet by mouth daily.     . psyllium (METAMUCIL) 58.6 % powder Take 1 packet by mouth daily as needed.     . Probiotic Product (PROBIOTIC ADVANCED PO) Take 1 capsule by mouth daily.      No facility-administered medications prior to visit.     Review of Systems  Constitutional: Negative for chills, fever, malaise/fatigue and weight loss.  HENT: Negative for ear discharge, ear pain and sore throat.   Eyes: Negative for blurred vision.  Respiratory: Negative for cough, sputum production, shortness of breath and wheezing.   Cardiovascular: Negative for chest pain, palpitations and leg swelling.  Gastrointestinal: Negative for abdominal pain, blood in stool, constipation, diarrhea, heartburn, melena and nausea.  Genitourinary: Negative for dysuria, frequency, hematuria and  urgency.  Musculoskeletal: Negative for back pain, joint pain, myalgias and neck pain.  Skin: Negative for rash.  Neurological: Negative for dizziness, tingling, sensory change, focal weakness and headaches.  Endo/Heme/Allergies: Negative for environmental allergies and polydipsia. Does not bruise/bleed easily.  Psychiatric/Behavioral: Negative for depression and suicidal ideas. The patient is not nervous/anxious and does not have insomnia.      Objective  Vitals:   02/22/18 1354  BP: 110/80  Pulse: 68  Resp: 12  SpO2: 98%  Weight: 168 lb (76.2 kg)  Height: 5\' 3"  (1.6 m)    Physical Exam  Constitutional: She is well-developed, well-nourished, and in no distress. No distress.  HENT:  Head: Normocephalic and atraumatic.  Right Ear: External ear normal.  Left Ear: External ear normal.  Nose: Nose normal.  Mouth/Throat: Oropharynx is clear and moist.  Eyes: Pupils are equal, round, and reactive to light. Conjunctivae and EOM are normal. Right eye exhibits no discharge. Left eye exhibits no discharge.  Neck: Normal range of motion. Neck supple. No JVD present. No thyromegaly present.  Cardiovascular: Normal rate, regular rhythm, normal heart sounds and intact distal pulses. Exam reveals no gallop and no friction rub.  No murmur heard. Pulmonary/Chest: Effort normal and breath sounds normal. She has no wheezes. She has no rales.  Abdominal: Soft. Bowel sounds are normal. She exhibits no mass. There is no tenderness. There is no guarding.  Genitourinary:  Genitourinary Comments: perivulvar incision normal /no erythema/ no tenderness/ no drainage.  Musculoskeletal: Normal range of motion. She exhibits no edema.  Lymphadenopathy:    She has no cervical adenopathy.  Neurological: She is alert. She has normal reflexes.  Skin: Skin is warm and dry. She is not diaphoretic.  Psychiatric: Mood and affect normal.  Nursing note and vitals reviewed.     Assessment & Plan  Problem List  Items Addressed This Visit      Other   Sepsis (HCC)    Other Visit Diagnoses    Fatigue, unspecified type    [facility] discharge follow-up          No orders of the defined types were placed in this encounter.     Dr. Hayden [facility]  02/22/18

## 2018-02-23 ENCOUNTER — Observation Stay: Payer: 59

## 2018-02-23 ENCOUNTER — Observation Stay
Admission: EM | Admit: 2018-02-23 | Discharge: 2018-02-25 | Disposition: A | Discharge status: Home / Self Care | Payer: 59 | Attending: Obstetrics and Gynecology | Admitting: Obstetrics and Gynecology

## 2018-02-23 ENCOUNTER — Encounter: Payer: Self-pay | Admitting: Emergency Medicine

## 2018-02-23 ENCOUNTER — Other Ambulatory Visit: Payer: Self-pay

## 2018-02-23 ENCOUNTER — Encounter: Payer: 59 | Admitting: Obstetrics and Gynecology

## 2018-02-23 DIAGNOSIS — N939 Abnormal uterine and vaginal bleeding, unspecified: Secondary | ICD-10-CM

## 2018-02-23 DIAGNOSIS — R55 Syncope and collapse: Secondary | ICD-10-CM

## 2018-02-23 DIAGNOSIS — Z79899 Other long term (current) drug therapy: Secondary | ICD-10-CM | POA: Diagnosis not present

## 2018-02-23 DIAGNOSIS — O99215 Obesity complicating the puerperium: Secondary | ICD-10-CM | POA: Insufficient documentation

## 2018-02-23 DIAGNOSIS — Z79890 Hormone replacement therapy (postmenopausal): Secondary | ICD-10-CM | POA: Insufficient documentation

## 2018-02-23 DIAGNOSIS — D649 Anemia, unspecified: Secondary | ICD-10-CM

## 2018-02-23 DIAGNOSIS — O9081 Anemia of the puerperium: Secondary | ICD-10-CM

## 2018-02-23 DIAGNOSIS — D500 Iron deficiency anemia secondary to blood loss (chronic): Secondary | ICD-10-CM | POA: Diagnosis not present

## 2018-02-23 DIAGNOSIS — Z7984 Long term (current) use of oral hypoglycemic drugs: Secondary | ICD-10-CM | POA: Insufficient documentation

## 2018-02-23 DIAGNOSIS — O2493 Unspecified diabetes mellitus in the puerperium: Secondary | ICD-10-CM | POA: Diagnosis not present

## 2018-02-23 LAB — BASIC METABOLIC PANEL
Anion gap: 6 (ref 5–15)
BUN: 6 mg/dL (ref 6–20)
CHLORIDE: 99 mmol/L — AB (ref 101–111)
CO2: 24 mmol/L (ref 22–32)
CREATININE: 0.65 mg/dL (ref 0.44–1.00)
Calcium: 7.7 mg/dL — ABNORMAL LOW (ref 8.9–10.3)
GFR calc non Af Amer: >60 mL/min (ref >60)
Glucose, Bld: 238 mg/dL — ABNORMAL HIGH (ref 65–99)
Potassium: 4.4 mmol/L (ref 3.5–5.1)
Sodium: 129 mmol/L — ABNORMAL LOW (ref 135–145)

## 2018-02-23 LAB — CBC WITH DIFFERENTIAL/PLATELET
BASOS ABS: 0 10*3/uL (ref 0–0.1)
Basophils Relative: 0 %
Eosinophils Absolute: 0 10*3/uL (ref 0–0.7)
Eosinophils Relative: 0 %
HEMATOCRIT: 18.2 % — AB (ref 35.0–47.0)
Hemoglobin: 5.9 g/dL — ABNORMAL LOW (ref 12.0–16.0)
LYMPHS ABS: 1.8 10*3/uL (ref 1.0–3.6)
LYMPHS PCT: 14 %
MCH: 28.2 pg (ref 26.0–34.0)
MCHC: 32.4 g/dL (ref 32.0–36.0)
MCV: 87.1 fL (ref 80.0–100.0)
MONO ABS: 0.8 10*3/uL (ref 0.2–0.9)
MONOS PCT: 6 %
NEUTROS ABS: 10.2 10*3/uL — AB (ref 1.4–6.5)
Neutrophils Relative %: 80 %
Platelets: 428 10*3/uL (ref 150–440)
RBC: 2.09 MIL/uL — ABNORMAL LOW (ref 3.80–5.20)
RDW: 15.2 % — AB (ref 11.5–14.5)
WBC: 12.9 10*3/uL — ABNORMAL HIGH (ref 3.6–11.0)

## 2018-02-23 LAB — TROPONIN I: Troponin I: <0.03 ng/mL (ref <0.03)

## 2018-02-23 LAB — HCG, QUANTITATIVE, PREGNANCY: hCG, Beta Chain, Quant, S: 49 m[IU]/mL — ABNORMAL HIGH (ref <5)

## 2018-02-23 LAB — PREPARE RBC (CROSSMATCH)

## 2018-02-23 MED ORDER — DIPHENHYDRAMINE HCL 25 MG PO CAPS: 25.0000 mg | ORAL_CAPSULE | Freq: Four times a day (QID) | ORAL | Status: DC | PRN

## 2018-02-23 MED ORDER — PRENATAL MULTIVITAMIN CH
1.0000 | ORAL_TABLET | Freq: Every day | ORAL | Status: DC
Start: 2018-02-24 — End: 2018-02-25
  Administered 2018-02-24 – 2018-02-25 (×2): 1 via ORAL
  Filled 2018-02-23 (×2): qty 1

## 2018-02-23 MED ORDER — SODIUM CHLORIDE 0.9 % IV BOLUS
1000.0000 mL | Freq: Once | INTRAVENOUS | Status: AC
  Administered 2018-02-23: 1000 mL via INTRAVENOUS

## 2018-02-23 MED ORDER — SIMETHICONE 80 MG PO CHEW: 80.0000 mg | CHEWABLE_TABLET | ORAL | Status: DC | PRN

## 2018-02-23 MED ORDER — ZOLPIDEM TARTRATE 5 MG PO TABS: 5.0000 mg | ORAL_TABLET | Freq: Every evening | ORAL | Status: DC | PRN

## 2018-02-23 MED ORDER — IBUPROFEN 600 MG PO TABS
600.0000 mg | ORAL_TABLET | Freq: Four times a day (QID) | ORAL | Status: DC
  Administered 2018-02-23 – 2018-02-25 (×6): 600 mg via ORAL
  Filled 2018-02-23 (×6): qty 1

## 2018-02-23 MED ORDER — ACETAMINOPHEN 325 MG PO TABS
650.0000 mg | ORAL_TABLET | ORAL | Status: DC | PRN
  Administered 2018-02-23 – 2018-02-24 (×2): 650 mg via ORAL
  Filled 2018-02-23 (×2): qty 2

## 2018-02-23 MED ORDER — DOCUSATE SODIUM 100 MG PO CAPS
100.0000 mg | ORAL_CAPSULE | Freq: Two times a day (BID) | ORAL | Status: DC
  Administered 2018-02-23 – 2018-02-25 (×4): 100 mg via ORAL
  Filled 2018-02-23 (×4): qty 1

## 2018-02-23 MED ORDER — SODIUM CHLORIDE 0.9 % IV SOLN
10.0000 mL/h | Freq: Once | INTRAVENOUS | Status: AC
  Administered 2018-02-23: 10 mL/h via INTRAVENOUS

## 2018-02-23 NOTE — ED Notes (Signed)
Lab called to add on HCG level 

## 2018-02-23 NOTE — ED Triage Notes (Signed)
Pt presents to ED via [facility] from home c/o syncopal episode. Pt reports she is 3 wks postpartum. Has had multiple visits for "sepsis" and has been on oral keflex at home. Today pt reports increased vaginal bleeding with clots and lower abd cramping, tender to palpation. Pt has follow-up appt with GYN today but had syncopal episode while on toilet today and came to ED instead. EMS report pt's initial SBP sitting up 70 palpated. Pt received NS from EMS, last pressure 126/61 lying down. Pt is A&Ox4, states she feels somewhat better after fluids.

## 2018-02-23 NOTE — ED Notes (Signed)
Type and screen sample redrawn

## 2018-02-23 NOTE — ED Provider Notes (Signed)
[address] [facility] Emergency Department Provider Note  ___________________________________________   First MD Initiated Contact with Patient 02/23/18 1424     (approximate)  I have reviewed the triage vital signs and the nursing notes.   HISTORY  Chief Complaint Loss of Consciousness   HPI [name] is a 32 y.o. female with a history of postpartum endometritis on Keflex who is presenting with vaginal bleeding.  The patient says that she has been passing clots since yesterday and has had lower abdominal cramping.  Says that she also passed out earlier today while sitting and that is when she called the ambulance.  Denies fever.  Hypotensive for EMS but given fluids and pressure now approximately 120 systolic.  Past Medical History:  Diagnosis Date  . Diabetes mellitus without complication (HCC)   . Hormone replacement therapy   . Hypertension    during pregnancy  . Premature ovarian failure     Patient Active Problem List   Diagnosis Date Noted  . History of pre-eclampsia 02/16/2018  . Sepsis (HCC) 02/15/2018  . Postpartum endometritis 02/13/2018  . Labor and delivery indication for care or intervention 01/31/2018  . PIH (pregnancy induced hypertension) 01/31/2018  . Pregnancy 01/29/2018  . Proteinuria affecting pregnancy, antepartum 01/28/2018  . Elevated blood pressure affecting pregnancy, antepartum 01/28/2018  . Preeclampsia, third trimester 01/28/2018  . 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 Pregnancy resulting from in vitro fertilization 09/17/2017  . Type 2 diabetes mellitus in pregnancy, third trimester 09/17/2017  . Obesity (BMI 30.0-34.9) 08/20/2017  . Type 2 diabetes mellitus without complication, without long-term current use of insulin (HCC) 11/26/2016  . 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 Infertility associated with anovulation 11/26/2016    Past Surgical History:  Procedure Laterality Date  . APPENDECTOMY      Prior  to Admission medications   Medication Sig Start Date End Date Taking? Authorizing Provider  cephALEXin (KEFLEX) 500 MG capsule Take 1 capsule (500 mg total) by mouth 3 (three) times daily. 02/17/18  Yes Hildred Laserherry, Anika, MD  metFORMIN (GLUCOPHAGE) 500 MG tablet Take 1 tablet (500 mg total) by mouth 2 (two) times daily with a meal. 02/17/18  Yes Hildred Laserherry, Anika, MD  Prenatal Vit-Fe Fumarate-FA (PRENATAL VITAMIN) 27-0.8 MG TABS Take 1 tablet by mouth daily.    Yes [provider]  conjugated estrogens (PREMARIN) vaginal cream 1/3 applicator in vagina before bedtime daily as directed. 02/04/18   Linzie CollinEvans, David James, MD  docusate sodium (COLACE) 100 MG capsule Take 100 mg by mouth 2 (two) times daily.    [provider]  psyllium (METAMUCIL) 58.6 % powder Take 1 packet by mouth daily as needed.     [provider]    Allergies Patient has no known allergies.  Family History  Problem Relation Age of Onset  . Diabetes Mother   . Hypertension Mother     Social History Social History   Tobacco Use  . Smoking status: Never Smoker  . Smokeless tobacco: Never Used  Substance Use Topics  . Alcohol use: No  . Drug use: No    Review of Systems  Constitutional: No fever/chills Eyes: No visual changes. ENT: No sore throat. Cardiovascular: Denies chest pain. Respiratory: Denies shortness of breath. Gastrointestinal: No nausea, no vomiting.  No diarrhea.  No constipation. Genitourinary: Negative for dysuria. Musculoskeletal: Negative for back pain. Skin: Negative for rash. Neurological: Negative for headaches, focal weakness or numbness.   ____________________________________________   PHYSICAL EXAM:  VITAL SIGNS: ED Triage Vitals  Enc Vitals Group     BP 02/23/18 1406 122/75     Pulse Rate 02/23/18 1423 (!) 104     Resp 02/23/18 1406 18     Temp 02/23/18 1428 97.9 F (36.6 C)     Temp Source 02/23/18 1406 Oral     SpO2 02/23/18 1423 100 %     Weight 02/23/18  1408 168 lb (76.2 kg)     Height 02/23/18 1408 5\' 3"  (1.6 m)     Head Circumference --      Peak Flow --      Pain Score 02/23/18 1407 6     Pain Loc --      Pain Edu? --      Excl. in GC? --     Constitutional: Alert and oriented. Well appearing and in no acute distress. Eyes: Conjunctivae are normal.  Head: Atraumatic. Nose: No congestion/rhinnorhea. Mouth/Throat: Mucous membranes are moist.  Neck: No stridor.   Cardiovascular: Normal rate, regular rhythm. Grossly normal heart sounds.  Good peripheral circulation. Respiratory: Normal respiratory effort.  No retractions. Lungs CTAB. Gastrointestinal: Soft with mild to moderate suprapubic tenderness to palpation without a boggy uterus.  No distention. No CVA tenderness. Genitourinary: Half-dollar sized clot at the introitus which I removed and there is no active bleeding. Musculoskeletal: No lower extremity tenderness nor edema.  No joint effusions. Neurologic:  Normal speech and language. No gross focal neurologic deficits are appreciated. Skin:  Skin is warm, dry and intact. No rash noted. Psychiatric: Mood and affect are normal. Speech and behavior are normal.  ____________________________________________   LABS (all labs ordered are listed, but only abnormal results are displayed)  Labs Reviewed  CBC WITH DIFFERENTIAL/PLATELET - Abnormal; Notable for the following components:      Result Value   WBC 12.9 (*)    RBC 2.09 (*)    Hemoglobin 5.9 (*)    HCT 18.2 (*)    RDW 15.2 (*)    Neutro Abs 10.2 (*)    All other components within normal limits  BASIC METABOLIC PANEL - Abnormal; Notable for the following components:   Sodium 129 (*)    Chloride 99 (*)    Glucose, Bld 238 (*)    Calcium 7.7 (*)    All other components within normal limits  TROPONIN I  TYPE AND SCREEN  PREPARE RBC (CROSSMATCH)   ____________________________________________  EKG  ED ECG REPORT I, Arelia Longest, the attending physician,  personally viewed and interpreted this ECG.   Date: 02/23/2018  EKG Time: 1422  Rate: 106  Rhythm: sinus tachycardia  Axis: Normal  Intervals:none  ST&T Change: No ST segment elevation or depression.  No abnormal T wave inversion.  ____________________________________________  RADIOLOGY   ____________________________________________   PROCEDURES  Procedure(s) performed:   .Critical Care Performed by: Myrna Blazer, MD Authorized by: Myrna Blazer, MD   Critical care provider statement:    Critical care time (minutes):  35   Critical care was necessary to treat or prevent imminent or life-threatening deterioration of the following conditions:  Circulatory failure   Critical care was time spent personally by me on the following activities:  Discussions with consultants, examination of patient, ordering and performing treatments and interventions, ordering and review of laboratory studies, ordering and review of radiographic studies and re-evaluation of patient's condition    Critical Care performed:   ____________________________________________   INITIAL IMPRESSION / ASSESSMENT AND PLAN / ED COURSE  Pertinent labs & imaging results  that were available during my care of the patient were reviewed by me and considered in my medical decision making (see chart for details).  DDX: Symptomatic anemia, uterine hemorrhage, endometritis, arrhythmia, syncope As part of my medical decision making, I reviewed the following data within the electronic MEDICAL RECORD NUMBER Reviewed notes from outpt visit.    ----------------------------------------- 3:30 PM on 02/23/2018 -----------------------------------------  Discussed case with Dr. Logan Bores who agrees to admit the patient.  Patient will be admitted to the postpartum unit L labor and delivery.  Patient and husband are aware of treatment plan and willing to  comply. ____________________________________________   FINAL CLINICAL IMPRESSION(S) / ED DIAGNOSES  Final diagnoses:  Syncope and collapse  Symptomatic anemia      NEW MEDICATIONS STARTED DURING THIS VISIT:  New Prescriptions   No medications on file     Note:  This document was prepared using Dragon voice recognition software and may include unintentional dictation errors.     Myrna Blazer, MD 02/23/18 [phone]

## 2018-02-24 ENCOUNTER — Ambulatory Visit: Admit: 2018-02-24 | Payer: 59 | Admitting: Obstetrics and Gynecology

## 2018-02-24 ENCOUNTER — Encounter: Payer: Self-pay | Admitting: Anesthesiology

## 2018-02-24 ENCOUNTER — Encounter
Admission: EM | Disposition: A | Discharge status: Home / Self Care | Payer: Self-pay | Source: Home / Self Care | Attending: Obstetrics and Gynecology

## 2018-02-24 ENCOUNTER — Observation Stay: Payer: 59 | Admitting: Anesthesiology

## 2018-02-24 HISTORY — PX: DILATION AND CURETTAGE OF UTERUS: SHX78

## 2018-02-24 LAB — CBC WITH DIFFERENTIAL/PLATELET
BASOS ABS: 0 10*3/uL (ref 0–0.1)
Basophils Absolute: 0.1 10*3/uL (ref 0–0.1)
Basophils Relative: 1 %
Basophils Relative: 0 %
EOS PCT: 1 %
Eosinophils Absolute: 0.2 10*3/uL (ref 0–0.7)
Eosinophils Absolute: 0.1 10*3/uL (ref 0–0.7)
Eosinophils Relative: 2 %
HCT: 23.9 % — ABNORMAL LOW (ref 35.0–47.0)
HCT: 22.7 % — ABNORMAL LOW (ref 35.0–47.0)
HEMOGLOBIN: 8.3 g/dL — AB (ref 12.0–16.0)
HEMOGLOBIN: 7.6 g/dL — AB (ref 12.0–16.0)
LYMPHS ABS: 2.6 10*3/uL (ref 1.0–3.6)
LYMPHS ABS: 1.4 10*3/uL (ref 1.0–3.6)
LYMPHS PCT: 25 %
LYMPHS PCT: 12 %
MCH: 29.2 pg (ref 26.0–34.0)
MCH: 28.6 pg (ref 26.0–34.0)
MCHC: 34.5 g/dL (ref 32.0–36.0)
MCHC: 33.6 g/dL (ref 32.0–36.0)
MCV: 84.7 fL (ref 80.0–100.0)
MCV: 84.9 fL (ref 80.0–100.0)
MONOS PCT: 10 %
Monocytes Absolute: 1 10*3/uL — ABNORMAL HIGH (ref 0.2–0.9)
Monocytes Absolute: 0.4 10*3/uL (ref 0.2–0.9)
Monocytes Relative: 3 %
NEUTROS PCT: 62 %
NEUTROS PCT: 84 %
Neutro Abs: 6.5 10*3/uL (ref 1.4–6.5)
Neutro Abs: 10.1 10*3/uL — ABNORMAL HIGH (ref 1.4–6.5)
PLATELETS: 394 10*3/uL (ref 150–440)
Platelets: 367 10*3/uL (ref 150–440)
RBC: 2.82 MIL/uL — AB (ref 3.80–5.20)
RBC: 2.67 MIL/uL — AB (ref 3.80–5.20)
RDW: 16 % — ABNORMAL HIGH (ref 11.5–14.5)
RDW: 16.2 % — ABNORMAL HIGH (ref 11.5–14.5)
WBC: 10.3 10*3/uL (ref 3.6–11.0)
WBC: 12.1 10*3/uL — AB (ref 3.6–11.0)

## 2018-02-24 LAB — TYPE AND SCREEN
ABO/RH(D): A POS
Antibody Screen: NEGATIVE
Unit division: 0
Unit division: 0

## 2018-02-24 LAB — CULTURE, BLOOD (ROUTINE X 2)
Culture: NO GROWTH
Culture: NO GROWTH
Special Requests: ADEQUATE
Special Requests: ADEQUATE

## 2018-02-24 LAB — BPAM RBC
BLOOD PRODUCT EXPIRATION DATE: 201905052359
Blood Product Expiration Date: 201905062359
ISSUE DATE / TIME: 201904101646
ISSUE DATE / TIME: 201904102141
UNIT TYPE AND RH: 5100
Unit Type and Rh: 5100

## 2018-02-24 LAB — GLUCOSE, CAPILLARY
GLUCOSE-CAPILLARY: 171 mg/dL — AB (ref 65–99)
GLUCOSE-CAPILLARY: 170 mg/dL — AB (ref 65–99)

## 2018-02-24 SURGERY — EXAM UNDER ANESTHESIA
Anesthesia: General

## 2018-02-24 MED ORDER — ROCURONIUM BROMIDE 50 MG/5ML IV SOLN
INTRAVENOUS | Status: AC
  Filled 2018-02-24: qty 1

## 2018-02-24 MED ORDER — LIDOCAINE HCL (CARDIAC) 20 MG/ML IV SOLN
INTRAVENOUS | Status: DC | PRN
  Administered 2018-02-24: 100 mg via INTRAVENOUS

## 2018-02-24 MED ORDER — DEXAMETHASONE SODIUM PHOSPHATE 10 MG/ML IJ SOLN
INTRAMUSCULAR | Status: AC
  Filled 2018-02-24: qty 1

## 2018-02-24 MED ORDER — MIDAZOLAM HCL 2 MG/2ML IJ SOLN
INTRAMUSCULAR | Status: AC
  Filled 2018-02-24: qty 2

## 2018-02-24 MED ORDER — PROPOFOL 10 MG/ML IV BOLUS
INTRAVENOUS | Status: DC | PRN
  Administered 2018-02-24: 150 mg via INTRAVENOUS

## 2018-02-24 MED ORDER — ONDANSETRON HCL 4 MG/2ML IJ SOLN
INTRAMUSCULAR | Status: AC
  Filled 2018-02-24: qty 2

## 2018-02-24 MED ORDER — DEXAMETHASONE SODIUM PHOSPHATE 10 MG/ML IJ SOLN
INTRAMUSCULAR | Status: DC | PRN
  Administered 2018-02-24: 10 mg via INTRAVENOUS

## 2018-02-24 MED ORDER — LIDOCAINE HCL (PF) 2 % IJ SOLN
INTRAMUSCULAR | Status: AC
  Filled 2018-02-24: qty 10

## 2018-02-24 MED ORDER — SUGAMMADEX SODIUM 200 MG/2ML IV SOLN
INTRAVENOUS | Status: DC | PRN
  Administered 2018-02-24: 152.4 mg via INTRAVENOUS

## 2018-02-24 MED ORDER — OXYTOCIN 10 UNIT/ML IJ SOLN
INTRAVENOUS | Status: DC | PRN
  Administered 2018-02-24: 40 [IU] via INTRAVENOUS

## 2018-02-24 MED ORDER — LACTATED RINGERS IV SOLN
INTRAVENOUS | Status: DC | PRN
  Administered 2018-02-24: 13:00:00 via INTRAVENOUS

## 2018-02-24 MED ORDER — PHENYLEPHRINE HCL 10 MG/ML IJ SOLN
INTRAMUSCULAR | Status: DC | PRN
  Administered 2018-02-24: 100 ug via INTRAVENOUS

## 2018-02-24 MED ORDER — FENTANYL CITRATE (PF) 100 MCG/2ML IJ SOLN
INTRAMUSCULAR | Status: AC
  Filled 2018-02-24: qty 2

## 2018-02-24 MED ORDER — PROPOFOL 10 MG/ML IV BOLUS
INTRAVENOUS | Status: AC
  Filled 2018-02-24: qty 20

## 2018-02-24 MED ORDER — KETOROLAC TROMETHAMINE 30 MG/ML IJ SOLN
INTRAMUSCULAR | Status: AC
  Filled 2018-02-24: qty 1

## 2018-02-24 MED ORDER — ONDANSETRON HCL 4 MG/2ML IJ SOLN
4.0000 mg | Freq: Once | INTRAMUSCULAR | Status: DC | PRN
Start: 2018-02-24 — End: 2018-02-24

## 2018-02-24 MED ORDER — MIDAZOLAM HCL 2 MG/2ML IJ SOLN
INTRAMUSCULAR | Status: DC | PRN
  Administered 2018-02-24: 2 mg via INTRAVENOUS

## 2018-02-24 MED ORDER — OXYTOCIN 10 UNIT/ML IJ SOLN
INTRAMUSCULAR | Status: AC
  Filled 2018-02-24: qty 4

## 2018-02-24 MED ORDER — ONDANSETRON HCL 4 MG/2ML IJ SOLN
INTRAMUSCULAR | Status: AC
Start: 2018-02-24 — End: ?
  Filled 2018-02-24: qty 2

## 2018-02-24 MED ORDER — ONDANSETRON HCL 4 MG/2ML IJ SOLN
INTRAMUSCULAR | Status: DC | PRN
  Administered 2018-02-24: 4 mg via INTRAVENOUS

## 2018-02-24 MED ORDER — FENTANYL CITRATE (PF) 100 MCG/2ML IJ SOLN
INTRAMUSCULAR | Status: DC | PRN
  Administered 2018-02-24: 50 ug via INTRAVENOUS

## 2018-02-24 MED ORDER — FENTANYL CITRATE (PF) 100 MCG/2ML IJ SOLN: 25.0000 ug | INTRAMUSCULAR | Status: DC | PRN

## 2018-02-24 MED ORDER — DEXTROSE IN LACTATED RINGERS 5 % IV SOLN
INTRAVENOUS | Status: DC
  Administered 2018-02-24: 10:00:00 via INTRAVENOUS

## 2018-02-24 MED ORDER — SUCCINYLCHOLINE CHLORIDE 20 MG/ML IJ SOLN
INTRAMUSCULAR | Status: AC
  Filled 2018-02-24: qty 1

## 2018-02-24 MED ORDER — DEXTROSE IN LACTATED RINGERS 5 % IV SOLN
INTRAVENOUS | Status: DC
  Administered 2018-02-24 – 2018-02-25 (×2): via INTRAVENOUS

## 2018-02-24 MED ORDER — OXYTOCIN 40 UNITS IN LACTATED RINGERS INFUSION - SIMPLE MED: 83.3300 m[IU]/min | INTRAVENOUS | Status: DC

## 2018-02-24 MED ORDER — SUCCINYLCHOLINE CHLORIDE 20 MG/ML IJ SOLN
INTRAMUSCULAR | Status: DC | PRN
  Administered 2018-02-24: 100 mg via INTRAVENOUS

## 2018-02-24 MED ORDER — ROCURONIUM BROMIDE 100 MG/10ML IV SOLN
INTRAVENOUS | Status: DC | PRN
  Administered 2018-02-24: 30 mg via INTRAVENOUS
  Administered 2018-02-24: 10 mg via INTRAVENOUS

## 2018-02-24 SURGICAL SUPPLY — 14 items
CUP MEDICINE 2OZ PLAST GRAD ST (MISCELLANEOUS) ×3 IMPLANT
DRSG TELFA 3X8 NADH (GAUZE/BANDAGES/DRESSINGS) ×3 IMPLANT
GLOVE BIOGEL PI ORTHO PRO 7.5 (GLOVE) ×2
GLOVE PI ORTHO PRO STRL 7.5 (GLOVE) ×1 IMPLANT
GOWN STRL REUS W/ TWL LRG LVL3 (GOWN DISPOSABLE) ×2 IMPLANT
GOWN STRL REUS W/TWL LRG LVL3 (GOWN DISPOSABLE) ×4
KIT TURNOVER CYSTO (KITS) ×3 IMPLANT
NS IRRIG 500ML POUR BTL (IV SOLUTION) ×3 IMPLANT
PACK DNC HYST (MISCELLANEOUS) ×3 IMPLANT
PAD OB MATERNITY 4.3X12.25 (PERSONAL CARE ITEMS) ×3 IMPLANT
PAD PREP 24X41 OB/GYN DISP (PERSONAL CARE ITEMS) ×3 IMPLANT
SET YANKAUER POOLE SUCT (MISCELLANEOUS) ×3 IMPLANT
SPONGE XRAY 4X4 16PLY STRL (MISCELLANEOUS) ×3 IMPLANT
TOWEL OR 17X26 4PK STRL BLUE (TOWEL DISPOSABLE) ×3 IMPLANT

## 2018-02-24 NOTE — Anesthesia Post-op Follow-up Note (Signed)
Anesthesia QCDR form completed.        

## 2018-02-24 NOTE — Plan of Care (Signed)
Pt. Is alert and oriented with cheerful affect. Color Pale, Skin w&d. BBS clear. VSS Using Incentive Spirometer as Instructed. Assisted up to Bathroom and Pt. Tolerated well. Denied feeling dizzy or light headed and had steady,normal gait. HGB remains low at 8.3 but Pt. Is thus far asymptomatic.

## 2018-02-24 NOTE — Anesthesia Preprocedure Evaluation (Addendum)
Anesthesia Evaluation  Patient identified by MRN, date of birth, ID band Patient awake    Reviewed: Allergy & Precautions, NPO status , Patient's Chart, lab work & pertinent test results  Airway Mallampati: II  TM Distance: >3 FB     Dental   Pulmonary neg pulmonary ROS,    Pulmonary exam normal        Cardiovascular hypertension, Normal cardiovascular exam     Neuro/Psych negative neurological ROS  negative psych ROS   GI/Hepatic negative GI ROS, Neg liver ROS,   Endo/Other  diabetes  Renal/GU negative Renal ROS  negative genitourinary   Musculoskeletal negative musculoskeletal ROS (+)   Abdominal Normal abdominal exam  (+)   Peds negative pediatric ROS (+)  Hematology  (+) anemia ,   Anesthesia Other Findings   Reproductive/Obstetrics                             Anesthesia Physical Anesthesia Plan  ASA: II  Anesthesia Plan: General   Post-op Pain Management:    Induction: Intravenous  PONV Risk Score and Plan:   Airway Management Planned: Oral ETT  Additional Equipment:   Intra-op Plan:   Post-operative Plan: Extubation in OR  Informed Consent: I have reviewed the patients History and Physical, chart, labs and discussed the procedure including the risks, benefits and alternatives for the proposed anesthesia with the patient or authorized representative who has indicated his/her understanding and acceptance.   Dental advisory given  Plan Discussed with: CRNA and Surgeon  Anesthesia Plan Comments:         Anesthesia Quick Evaluation

## 2018-02-24 NOTE — H&P (Signed)
Marland KitchenMarland KitchenMarland KitchenMarland Kitchen....    History and Physical   HPI  [name] is a 32 y.o. G1P0101 at who presented to the emergency department after a day of heavy bleeding passing clots and cramps.  She became lightheaded at home and came to the emergency department.  In the emergency department she was found to have a low hemoglobin consistent with blood loss anemia.   Of significant note her antepartum care labor delivery and postpartum course were complicated.  Her antepartum care was complicated by previous infertility with subsequent examination for this pregnancy.  Her labor was complicated by pregnancy-induced hypertension and preterm birth.  Her delivery was complicated by massive vaginal edema with subsequent vaginal damage at birth as well as a moderately retained placenta with postpartum hemorrhage.  The remainder of her postpartum course was complicated by return to the hospital for afebrile illness consistent with sepsis.  This subsequently resolved with intravenous antibiotics.  She did present to the emergency department a few days later with an abscess which quickly resolved with I&D.  Since that time she has been afebrile and has described minimal vaginal bleeding daily. Yesterday her bleeding increased dramatically and she passed several large clots before presented to the emergency department.   She completed her course of antibiotics 2 days ago. She is pumping for breastmilk and bottlefeeding the baby who remains in the NICU. Last night she passed a large clot. Ultrasound yesterday revealed heterogeneous appearing endometrial cavity possibly consistent with blood clot, possibly consistent with small amount of retained products of conception.   OB History  OB History  Gravida Para Term Preterm AB Living  1 1 0 1 0 1  SAB TAB Ectopic Multiple Live Births  0 0 0 0 1    # Outcome Date GA Lbr Len/2nd Weight Sex Delivery Anes PTL Lv  1 Preterm 02/01/18 [redacted]w[redacted]d / 01:44 6 lb 2.8 oz (2.8 kg) M  Vag-Vacuum None  LIV     Name: Bosket,BOY Brailee     Apgar1: 4  Apgar5: 6    PROBLEM LIST  Pregnancy complications or risks: Patient Active Problem List   Diagnosis Date Noted  . Blood loss anemia 02/23/2018  . History of pre-eclampsia 02/16/2018  . Sepsis (HCC) 02/15/2018  . Postpartum endometritis 02/13/2018  . Labor and delivery indication for care or intervention 01/31/2018  . PIH (pregnancy induced hypertension) 01/31/2018  . Pregnancy 01/29/2018  . Proteinuria affecting pregnancy, antepartum 01/28/2018  . Elevated blood pressure affecting pregnancy, antepartum 01/28/2018  . Preeclampsia, third trimester 01/28/2018  . 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 Pregnancy resulting from in vitro fertilization 09/17/2017  . Type 2 diabetes mellitus in pregnancy, third trimester 09/17/2017  . Obesity (BMI 30.0-34.9) 08/20/2017  . Type 2 diabetes mellitus without complication, without long-term current use of insulin (HCC) 11/26/2016  . 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 Infertility associated with anovulation 11/26/2016    Prenatal labs and studies: ABO, Rh: --/--/A POS (04/10 1435) Antibody: NEG (04/10 1435) Rubella: 16.20 (10/05 1447) RPR: Non Reactive (03/19 0023)  HBsAg: Negative (10/05 1447)  HIV: Non Reactive (10/05 1447)    Past Medical History:  Diagnosis Date  . Diabetes mellitus without complication (HCC)   . Hormone replacement therapy   . Hypertension    during pregnancy  . Premature ovarian failure      Past Surgical History:  Procedure Laterality Date  . APPENDECTOMY       Medications    Current Discharge Medication List    CONTINUE these medications  which have NOT CHANGED   Details  cephALEXin (KEFLEX) 500 MG capsule Take 1 capsule (500 mg total) by mouth 3 (three) times daily. Qty: 18 capsule, Refills: 0    metFORMIN (GLUCOPHAGE) 500 MG tablet Take 1 tablet (500 mg total) by mouth 2 (two) times daily with a meal. Qty: 60 tablet,  Refills: 3    Prenatal Vit-Fe Fumarate-FA (PRENATAL VITAMIN) 27-0.8 MG TABS Take 1 tablet by mouth daily.     conjugated estrogens (PREMARIN) vaginal cream 1/3 applicator in vagina before bedtime daily as directed. Qty: 60 g, Refills: 1    docusate sodium (COLACE) 100 MG capsule Take 100 mg by mouth 2 (two) times daily.    psyllium (METAMUCIL) 58.6 % powder Take 1 packet by mouth daily as needed.          Allergies  Patient has no known allergies.  Review of Systems Pertinent items are noted in HPI.  Physical Exam  BP 111/72 (BP Location: Left Arm)   Pulse 90   Temp 97.8 F (36.6 C) (Oral)   Resp 18   Ht 5\' 3"  (1.6 m)   Wt 168 lb (76.2 kg)   SpO2 98%   BMI 29.76 kg/m   Lungs:  CTA B Cardio: RRR without M/R/G Abd: Soft, NT  EXT: No C/C/ 1+ Edema DTRs: 2+ B See previous records for more detailed PE.     Test Results  Results for orders placed or performed during the hospital encounter of 02/23/18 (from the past 24 hour(s))  CBC with Differential     Status: Abnormal   Collection Time: 02/23/18  2:08 PM  Result Value Ref Range   WBC 12.9 (H) 3.6 - 11.0 K/uL   RBC 2.09 (L) 3.80 - 5.20 MIL/uL   Hemoglobin 5.9 (L) 12.0 - 16.0 g/dL   HCT 57.8 (L) 46.9 - 62.9 %   MCV 87.1 80.0 - 100.0 fL   MCH 28.2 26.0 - 34.0 pg   MCHC 32.4 32.0 - 36.0 g/dL   RDW 52.8 (H) 41.3 - 24.4 %   Platelets 428 150 - 440 K/uL   Neutrophils Relative % 80 %   Neutro Abs 10.2 (H) 1.4 - 6.5 K/uL   Lymphocytes Relative 14 %   Lymphs Abs 1.8 1.0 - 3.6 K/uL   Monocytes Relative 6 %   Monocytes Absolute 0.8 0.2 - 0.9 K/uL   Eosinophils Relative 0 %   Eosinophils Absolute 0.0 0 - 0.7 K/uL   Basophils Relative 0 %   Basophils Absolute 0.0 0 - 0.1 K/uL  Troponin I     Status: None   Collection Time: 02/23/18  2:08 PM  Result Value Ref Range   Troponin I <0.03 <0.03 ng/mL  Basic metabolic panel     Status: Abnormal   Collection Time: 02/23/18  2:08 PM  Result Value Ref Range   Sodium  129 (L) 135 - 145 mmol/L   Potassium 4.4 3.5 - 5.1 mmol/L   Chloride 99 (L) 101 - 111 mmol/L   CO2 24 22 - 32 mmol/L   Glucose, Bld 238 (H) 65 - 99 mg/dL   BUN 6 6 - 20 mg/dL   Creatinine, Ser 0.10 0.44 - 1.00 mg/dL   Calcium 7.7 (L) 8.9 - 10.3 mg/dL   GFR calc non Af Amer >60 >60 mL/min   GFR calc Af Amer >60 >60 mL/min   Anion gap 6 5 - 15  hCG, quantitative, pregnancy     Status: Abnormal  Collection Time: 02/23/18  2:08 PM  Result Value Ref Range   hCG, Beta Chain, Quant, S 49 (H) <5 mIU/mL  Type and screen Ordered by PROVIDER DEFAULT     Status: None   Collection Time: 02/23/18  2:35 PM  Result Value Ref Range   ABO/RH(D) A POS    Antibody Screen NEG    Sample Expiration 02/26/2018    Unit Number [id number]    Blood Component Type RED CELLS,LR    Unit division 00    Status of Unit ISSUED,FINAL    Transfusion Status OK TO TRANSFUSE    Crossmatch Result      Compatible Performed at [facility], [address] [place], [place] [address]    Unit Number [id number]    Blood Component Type RED CELLS,LR    Unit division 00    Status of Unit ISSUED,FINAL    Transfusion Status OK TO TRANSFUSE    Crossmatch Result Compatible   Prepare RBC     Status: None   Collection Time: 02/23/18  3:09 PM  Result Value Ref Range   Order Confirmation      ORDER PROCESSED BY BLOOD BANK Performed at [facility], [address] [place], [place] [address]   CBC with Differential/Platelet     Status: Abnormal   Collection Time: 02/24/18  5:43 AM  Result Value Ref Range   WBC 10.3 3.6 - 11.0 K/uL   RBC 2.82 (L) 3.80 - 5.20 MIL/uL   Hemoglobin 8.3 (L) 12.0 - 16.0 g/dL   HCT 44.023.9 (L) 10.235.0 - 72.547.0 %   MCV 84.7 80.0 - 100.0 fL   MCH 29.2 26.0 - 34.0 pg   MCHC 34.5 32.0 - 36.0 g/dL   RDW 36.616.0 (H) 44.011.5 - 34.714.5 %   Platelets 367 150 - 440 K/uL   Neutrophils Relative % 62 %   Neutro Abs 6.5 1.4 - 6.5 K/uL   Lymphocytes Relative 25 %   Lymphs Abs 2.6 1.0 - 3.6 K/uL     Monocytes Relative 10 %   Monocytes Absolute 1.0 (H) 0.2 - 0.9 K/uL   Eosinophils Relative 2 %   Eosinophils Absolute 0.2 0 - 0.7 K/uL   Basophils Relative 1 %   Basophils Absolute 0.1 0 - 0.1 K/uL     Assessment Blood loss anemia If bleeding and passage of clots continues strongly consider exam under anesthesia with D&E  Patient Active Problem List   Diagnosis Date Noted  . Blood loss anemia 02/23/2018  . History of pre-eclampsia 02/16/2018  . Sepsis (HCC) 02/15/2018  . Postpartum endometritis 02/13/2018  . Labor and delivery indication for care or intervention 01/31/2018  . PIH (pregnancy induced hypertension) 01/31/2018  . Pregnancy 01/29/2018  . Proteinuria affecting pregnancy, antepartum 01/28/2018  . Elevated blood pressure affecting pregnancy, antepartum 01/28/2018  . Preeclampsia, third trimester 01/28/2018  . 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 Pregnancy resulting from in vitro fertilization 09/17/2017  . Type 2 diabetes mellitus in pregnancy, third trimester 09/17/2017  . Obesity (BMI 30.0-34.9) 08/20/2017  . Type 2 diabetes mellitus without complication, without long-term current use of insulin (HCC) 11/26/2016  . 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 Infertility associated with anovulation 11/26/2016    Plan  1.  If bleeding and passage of clots continues strongly consider exam under anesthesia with D&E 2.  Consider low-dose estrogen therapy if bleeding resolves or status post D&E    Elonda Huskyavid J. Evans, M.D. 02/24/2018 10:46 AM

## 2018-02-24 NOTE — Lactation Note (Signed)
Lactation Consultation Note  Patient Name: Heather CurtRadha Montes RUEAV'WToday's Date: 02/24/2018     Maternal Data  Pt has questions about pumping after anesthesia for d+c, these questions were answered, pt will start pumping again and will collect milk and refrigerate until her discharge     [facility]                   Interventions    Lactation Tools Discussed/Used     Consult Status      Heather KiefMarsha D Saron Montes 02/24/2018, 4:23 PM

## 2018-02-24 NOTE — Interval H&P Note (Signed)
History and Physical Interval Note:  02/24/2018 11:48 AM  [name]  has presented today for surgery, with the diagnosis of RETAINED PRODUCT  The various methods of treatment have been discussed with the patient and family. After consideration of risks, benefits and other options for treatment, the patient has consented to  Procedure(s): EXAM UNDER ANESTHESIA (N/A) DILATATION AND CURETTAGE (N/A) as a surgical intervention .  The patient's history has been reviewed, patient examined, no change in status, stable for surgery.  I have reviewed the patient's chart and labs.  Questions were answered to the patient's satisfaction.     Brennan Baileyavid Kaleya Douse

## 2018-02-24 NOTE — Transfer of Care (Signed)
Immediate Anesthesia Transfer of Care Note  Patient: [name]  Procedure(s) Performed: EXAM UNDER ANESTHESIA (N/A ) DILATATION AND CURETTAGE (N/A )  Patient Location: PACU  Anesthesia Type:General  Level of Consciousness: sedated  Airway & Oxygen Therapy: Patient Spontanous Breathing and Patient connected to face mask oxygen  Post-op Assessment: Report given to RN and Post -op Vital signs reviewed and stable  Post vital signs: Reviewed and stable  Last Vitals:  Vitals Value Taken Time  BP 120/60 02/24/2018  2:08 PM  Temp    Pulse 93 02/24/2018  2:08 PM  Resp 11 02/24/2018  2:08 PM  SpO2 100 % 02/24/2018  2:08 PM  Vitals shown include unvalidated device data.  Last Pain:  Vitals:   02/24/18 1138  TempSrc:   PainSc: 4       Patients Stated Pain Goal: 0 (02/23/18 2000)  Complications: No apparent anesthesia complications

## 2018-02-24 NOTE — Anesthesia Procedure Notes (Signed)
Procedure Name: Intubation Date/Time: 02/24/2018 1:38 PM Performed by: Nelda Marseille, CRNA Pre-anesthesia Checklist: Patient identified, Patient being monitored, Timeout performed, Emergency Drugs available and Suction available Patient Re-evaluated:Patient Re-evaluated prior to induction Oxygen Delivery Method: Circle system utilized Preoxygenation: Pre-oxygenation with 100% oxygen Induction Type: IV induction Ventilation: Mask ventilation without difficulty Laryngoscope Size: Mac, 3 and McGraph Grade View: Grade III Tube type: Oral Tube size: 7.0 mm Number of attempts: 1 Airway Equipment and Method: Stylet Placement Confirmation: ETT inserted through vocal cords under direct vision,  positive ETCO2 and breath sounds checked- equal and bilateral Secured at: 21 cm Tube secured with: Tape Dental Injury: Teeth and Oropharynx as per pre-operative assessment

## 2018-02-24 NOTE — Progress Notes (Signed)
RN checked peripad. Small amount of bright red blood on pad. Patient requested assistance to bedside commode. RN helped patient to commode. Upon standing, patient passed 2 large clots weighing 0.225 kg (size of a placenta). Patient voided 700 mL at this time. BP 109/68   Pulse 93   Temp 97.7 F (36.5 C) (Oral)   Resp (!) 22   Ht 5\' 3"  (160 cm)   Wt 168 lb (76204 g)   SpO2 100%   BMI 29.76 kg/m . Peri-care performed, patient assisted back to bed safely. Dr. Logan BoresEvans notifed. MD order to make patient NPO and have morning CBC drawn. Waiting for results. Will continue to monitor.

## 2018-02-24 NOTE — Op Note (Signed)
    OPERATIVE NOTE 02/24/2018 2:36 PM  PRE-OPERATIVE DIAGNOSIS:  1) RETAINED PRODUCT  POST-OPERATIVE DIAGNOSIS:  * No Diagnosis Codes entered *  OPERATION:  D&E   Exam under Anesthesia  SURGEON(S): Surgeon(s) and Role:    Linzie Collin* Ladasha Schnackenberg James, MD - Primary   ANESTHESIA: Choice  ESTIMATED BLOOD LOSS: 300 mL  OPERATIVE FINDINGS: Suspected retained placental fragment.  Vagina well-healed.  Episiotomy intact.  SPECIMEN:  ID Type Source Tests Collected by Time Destination  1 :  uterian contents Tissue [facility] Other SURGICAL PATHOLOGY Linzie CollinEvans, Korver Graybeal James, MD 02/24/2018 1348     COMPLICATIONS: None  DRAINS: None  DISPOSITION: Stable to recovery room  DESCRIPTION OF PROCEDURE:      The patient was prepped and draped in the dorsal lithotomy position and placed under general anesthesia. Her cervix was grasped with a ring forceps.  Some blood clot and tissue was noted that the cervical os.  This was removed with ring forceps.  The cervix was noted to be dilated.  A large banjo curette was placed in the endometrial cavity and systematic curettage was performed.  This was productive of several fragments of what appeared to be placenta.  A gritty texture was then noted in all quadrants.  After completion of the curettage, bleeding resolved.  Uterus is noted to be firm.  Pitocin was run in the IV. The ring forceps was removed from the cervix and hemostasis was noted.  A careful examination of the vagina was performed vaginal mucosa was noted to be intact and healing well.  A rectal examination was performed and the episiotomy was also noted to be intact with no evidence of defect. The patient went to the recovery room in stable condition.  Elonda Huskyavid J. [place]iah Trawick, M.D. 02/24/2018 2:36 PM

## 2018-02-25 ENCOUNTER — Encounter: Payer: Self-pay | Admitting: Obstetrics and Gynecology

## 2018-02-25 NOTE — Progress Notes (Signed)
Patient discharged home with spouse. Discharge instructions, prescriptions and follow up appointment given to and reviewed with patient and spouse. Patient verbalized understanding. Escorted out via wheelchair by auxiliary.  

## 2018-02-25 NOTE — Discharge Summary (Signed)
Discharge Summary  Date of Admission: 02/23/2018  Date of Discharge: 02/25/2018  Admitting Diagnosis: Blood loss anemia     Discharge Diagnosis: Blood loss anemia with retained  products of conception.   Narrative summary: Patient has had numerous antepartum and postpartum complications with this pregnancy.  For this admission she presented to the emergency department complaining of lightheadedness after  passing  several large clots per vagina at while at home.  Upon presentation to the emergency department she was found to have a hemoglobin of 5.  She received 2 units of packed cells. An ultrasound was performed which revealed a large clot in the uterus and the possibility of some retained products.  Over the next several hours she continued to have moderate vaginal bleeding as well as the passage of a large clot. I took her to the operating room and performed a exam under anesthesia and D&E I found that she had a small retained piece of placenta..  (See op note for additional details) Since that time her bleeding has been scant.  She has not been lightheaded during routine activities.  She has been afebrile throughout her stay.                      Discharge Day SOAP Note:  Subjective  She feels well today.  Denies lightheadedness when out of bed.  Has gotten some of her "energy" back.  She is eating without problem.   Patient is urinating without difficulty.  She is ambulating well.  She has had minimal bleeding.  She denies pelvic or abdominal pain.  Objective  Vital signs: BP 110/67 (BP Location: Left Arm)   Pulse 93   Temp 98.6 F (37 C) (Oral)   Resp 18   Ht 5\' 3"  (1.6 m)   Wt 168 lb (76.2 kg)   SpO2 100%   BMI 29.76 kg/m   Physical Exam: Her abdomen is soft nontender. Minimal vaginal bleeding.   Data Review Labs: CBC Latest Ref Rng & Units 02/24/2018 02/24/2018 02/23/2018  WBC 3.6 - 11.0 K/uL 12.1(H) 10.3 12.9(H)  Hemoglobin 12.0 - 16.0  g/dL 7.6(L) 8.3(L) 5.9(L)  Hematocrit 35.0 - 47.0 % 22.7(L) 23.9(L) 18.2(L)  Platelets 150 - 440 K/uL 394 367 428   A POS  Assessment/Plan  Active Problems:   Blood loss anemia    Plan for discharge today.   Discharge Instructions: Per After Visit Summary. Activity: Advance as tolerated. Pelvic rest for 4 weeks.  Also refer to After Visit Summary Diet: Regular Medications: Allergies as of 02/25/2018   No Known Allergies     Medication List    STOP taking these medications   cephALEXin 500 MG capsule Commonly known as:  KEFLEX     TAKE these medications   conjugated estrogens vaginal cream Commonly known as:  PREMARIN 1/3 applicator in vagina before bedtime daily as directed.   docusate sodium 100 MG capsule Commonly known as:  COLACE Take 100 mg by mouth 2 (two) times daily.   metFORMIN 500 MG tablet Commonly known as:  GLUCOPHAGE Take 1 tablet (500 mg total) by mouth 2 (two) times daily with a meal.   Prenatal Vitamin 27-0.8 MG Tabs Take 1 tablet by mouth daily.   psyllium 58.6 % powder Commonly known as:  METAMUCIL Take 1 packet by mouth  daily as needed.      Outpatient follow up:  Follow-up Information    Linzie Collin, MD Follow up in 12 day(s).   Specialty:  Obstetrics and Gynecology Why:  as scheduled Contact information: [address] [place] [address] [phone]          Postpartum contraception: Will discuss at first office visit post-partum   Discharged to: home    Elonda Husky, M.D. 02/25/2018 8:58 AM

## 2018-03-01 LAB — SURGICAL PATHOLOGY

## 2018-03-01 NOTE — Addendum Note (Signed)
Addended by: Everitt AmberLYNCH, Annaleigh Steinmeyer L on: 03/01/2018 04:49 PM   Modules accepted: Level of Service

## 2018-03-01 NOTE — Anesthesia Postprocedure Evaluation (Signed)
Anesthesia Post Note  Patient: [name]  Procedure(s) Performed: EXAM UNDER ANESTHESIA (N/A ) DILATATION AND CURETTAGE (N/A )  Patient location during evaluation: PACU Anesthesia Type: General Level of consciousness: awake and alert and oriented Pain management: pain level controlled Vital Signs Assessment: post-procedure vital signs reviewed and stable Respiratory status: spontaneous breathing Cardiovascular status: blood pressure returned to baseline Anesthetic complications: no     Last Vitals:  Vitals:   02/25/18 0326 02/25/18 0820  BP: 101/63 110/67  Pulse: 81 93  Resp: 18 18  Temp: 36.8 C 37 C  SpO2: 100% 100%    Last Pain:  Vitals:   02/25/18 1002  TempSrc:   PainSc: 0-No pain                 Mirel Hundal

## 2018-03-10 ENCOUNTER — Encounter: Payer: 59 | Admitting: Obstetrics and Gynecology

## 2018-03-15 ENCOUNTER — Ambulatory Visit (INDEPENDENT_AMBULATORY_CARE_PROVIDER_SITE_OTHER): Payer: 59 | Admitting: Obstetrics and Gynecology

## 2018-03-15 ENCOUNTER — Encounter: Payer: Self-pay | Admitting: Obstetrics and Gynecology

## 2018-03-15 NOTE — Progress Notes (Signed)
HPI:      [name] is a 32 y.o. G1P0101 who LMP was No LMP recorded.  Subjective:   She presents today for her 6-week postpartum check.  She is doing well.  The baby has come home from the hospital.  She is pumping and breast-feeding.  She reports no issues with bowel movements voiding eating.  She has stopped her stool softener and is having no issues.  She has not resumed intercourse. She has a history of premature menopause/ovarian failure and is wondering about birth control.  She would not be upset if she became pregnant but generally considers this impossible with her previous diagnosis.    Hx: The following portions of the patient's history were reviewed and updated as appropriate:             She  has a past medical history of Diabetes mellitus without complication (HCC), Hormone replacement therapy, Hypertension, and Premature ovarian failure. She does not have any pertinent problems on file. She  has a past surgical history that includes Appendectomy and Dilation and curettage of uterus (N/A, 02/24/2018). Her family history includes Diabetes in her mother; Hypertension in her mother. She  reports that she has never smoked. She has never used smokeless tobacco. She reports that she does not drink alcohol or use drugs. She has a current medication list which includes the following prescription(s): metformin and prenatal vitamin. She has No Known Allergies.       Review of Systems:  Review of Systems  Constitutional: Denied constitutional symptoms, night sweats, recent illness, fatigue, fever, insomnia and weight loss.  Eyes: Denied eye symptoms, eye pain, photophobia, vision change and visual disturbance.  Ears/Nose/Throat/Neck: Denied ear, nose, throat or neck symptoms, hearing loss, nasal discharge, sinus congestion and sore throat.  Cardiovascular: Denied cardiovascular symptoms, arrhythmia, chest pain/pressure, edema, exercise intolerance, orthopnea and palpitations.   Respiratory: Denied pulmonary symptoms, asthma, pleuritic pain, productive sputum, cough, dyspnea and wheezing.  Gastrointestinal: Denied, gastro-esophageal reflux, melena, nausea and vomiting.  Genitourinary: Denied genitourinary symptoms including symptomatic vaginal discharge, pelvic relaxation issues, and urinary complaints.  Musculoskeletal: Denied musculoskeletal symptoms, stiffness, swelling, muscle weakness and myalgia.  Dermatologic: Denied dermatology symptoms, rash and scar.  Neurologic: Denied neurology symptoms, dizziness, headache, neck pain and syncope.  Psychiatric: Denied psychiatric symptoms, anxiety and depression.  Endocrine: Denied endocrine symptoms including hot flashes and night sweats.   Meds:   Current Outpatient Medications on File Prior to Visit  Medication Sig Dispense Refill  . metFORMIN (GLUCOPHAGE) 500 MG tablet Take 1 tablet (500 mg total) by mouth 2 (two) times daily with a meal. 60 tablet 3  . Prenatal Vit-Fe Fumarate-FA (PRENATAL VITAMIN) 27-0.8 MG TABS Take 1 tablet by mouth daily.      No current facility-administered medications on file prior to visit.     Objective:     Vitals:   03/15/18 0817  BP: 119/79  Pulse: 87              Pelvic examination   Pelvic:   Vulva: Normal appearance.  No lesions.  No abnormal scarring.    Vagina: No lesions or abnormalities noted.  Support: Normal pelvic support.  Urethra No masses tenderness or scarring.  Meatus Normal size without lesions or prolapse.  Cervix: Normal ectropion.  No lesions.  Anus: Normal exam.  No lesions.  Perineum: Normal exam.  No lesions.  Healed well.          Bimanual   Uterus: Normal size.  Non-tender.  Mobile.  AV.  Adnexae: No masses.  Non-tender to palpation.  Cul-de-sac: Negative for abnormality.     Assessment:    G1P0101 Patient Active Problem List   Diagnosis Date Noted  . Blood loss anemia 02/23/2018  . History of pre-eclampsia 02/16/2018  . Sepsis (HCC)  02/15/2018  . Postpartum endometritis 02/13/2018  . Labor and delivery indication for care or intervention 01/31/2018  . PIH (pregnancy induced hypertension) 01/31/2018  . Pregnancy 01/29/2018  . Proteinuria affecting pregnancy, antepartum 01/28/2018  . Elevated blood pressure affecting pregnancy, antepartum 01/28/2018  . Preeclampsia, third trimester 01/28/2018  . 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 Pregnancy resulting from in vitro fertilization 09/17/2017  . Type 2 diabetes mellitus in pregnancy, third trimester 09/17/2017  . Obesity (BMI 30.0-34.9) 08/20/2017  . Type 2 diabetes mellitus without complication, without long-term current use of insulin (HCC) 11/26/2016  . 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 Postpartum care and examination immediately after delivery     Patient has a history of premature menopause but there is no evidence of hypoestrogenism in the vagina at this time.   Plan:            1.  Expectant management while the patient breast-feeds.  The understanding is that if she is in premature menopause we will need to prescribe OCPs or at least some form of estrogen after she discontinues breast-feeding.  Should she have any issues prior to that she will contact us.  At this time she does not want to begin estrogen because of its possible effect on decreasing breastmilk and her pumping and breast-feeding is tenuous at this time. Orders No orders of the defined types were placed in this encounter.   No orders of the defined types were placed in this encounter.     F/U  Return in about 3 months (around 06/14/2018).  Elonda Husky, M.D. 03/15/2018 8:45 AM

## 2018-05-28 IMAGING — CT CT ANGIO CHEST
2 of 6 series · 18 of 46 positions shown · IV contrast (APPLIED)
Comparison: Current chest radiographs.

CLINICAL DATA: Patient is 2 weeks postpartum with tachycardia and
shortness of breath. Also reportedly with fever.

EXAM:
CT ANGIOGRAPHY CHEST WITH CONTRAST
TECHNIQUE: Multidetector CT imaging of the chest was performed using the
standard protocol during bolus administration of intravenous
contrast. Multiplanar CT image reconstructions and MIPs were
obtained to evaluate the vascular anatomy.
CONTRAST:  75mL OMNIPAQUE IOHEXOL 350 MG/ML SOLN

[Series 5: thins · axial · 0.63mm/px · z∈[-243,+20]mm · 15 of 289 slices shown]
[im 13/289  lung]
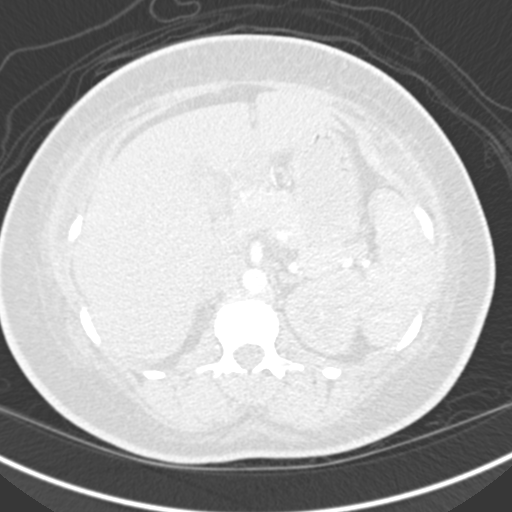
[im 38/289  soft-tissue]
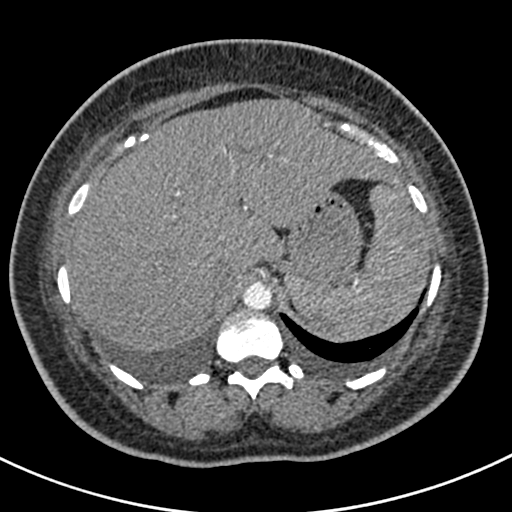
[im 51/289  lung]
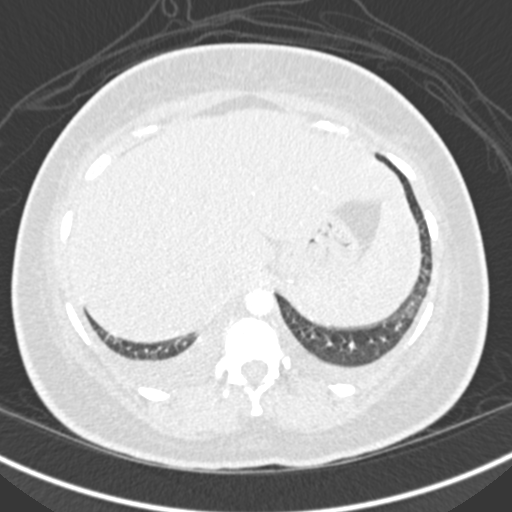
[im 76/289  soft-tissue]
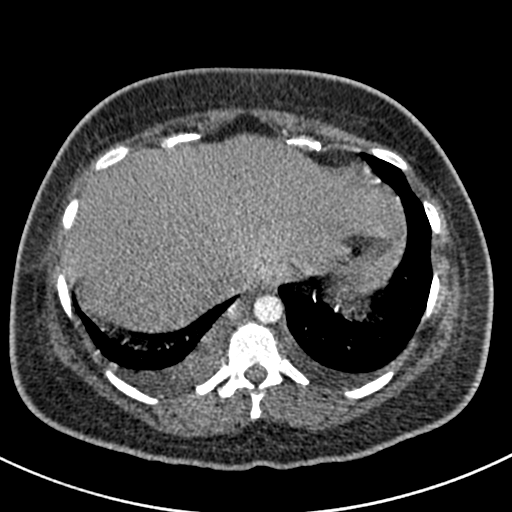
[im 88/289  lung]
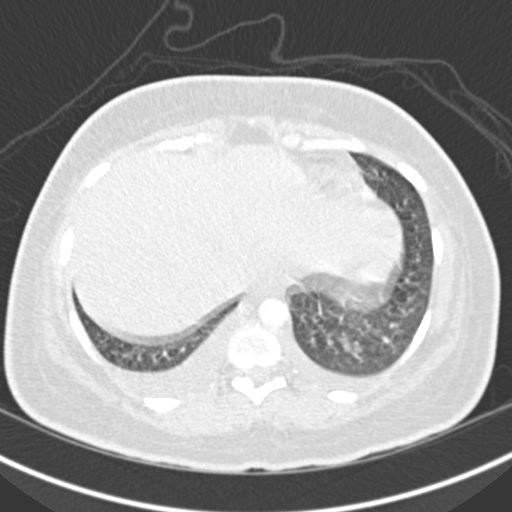
[im 113/289  soft-tissue]
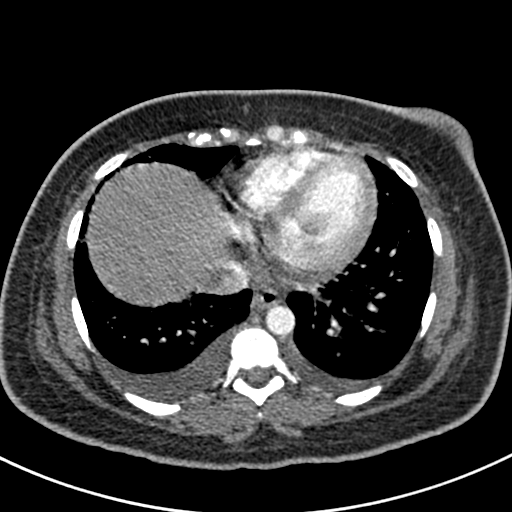
[im 126/289  lung]
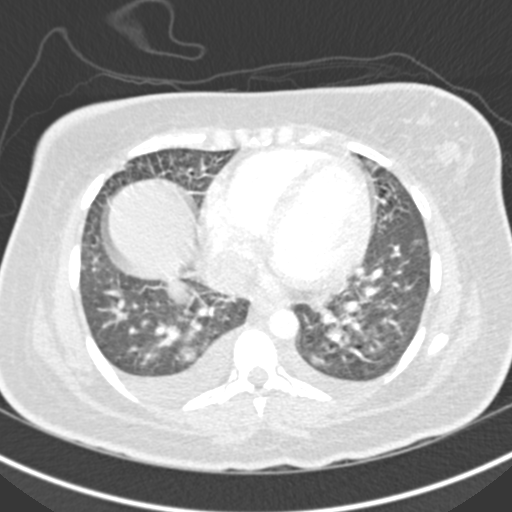
[im 151/289  soft-tissue]
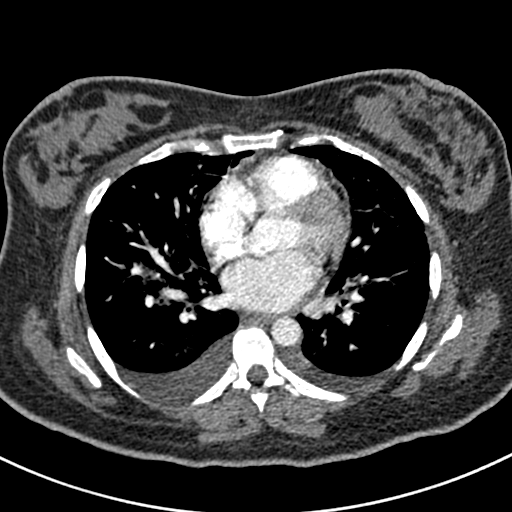
[im 163/289  lung]
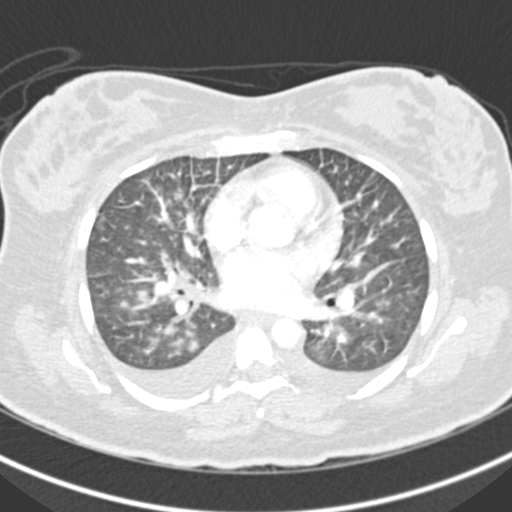
[im 176/289  soft-tissue]
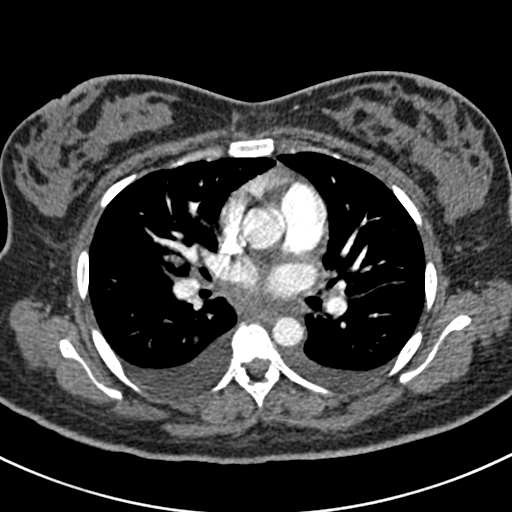
[im 201/289  lung]
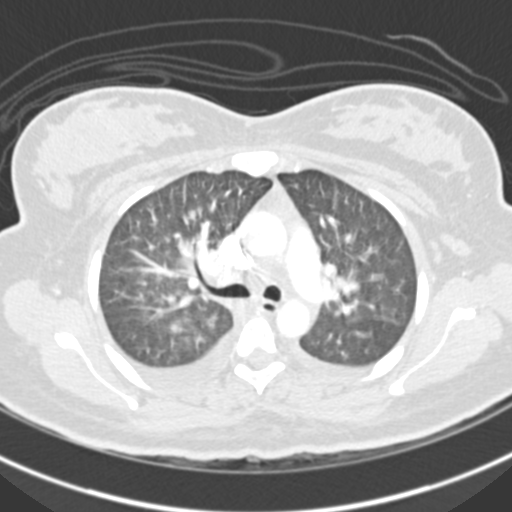
[im 213/289  soft-tissue]
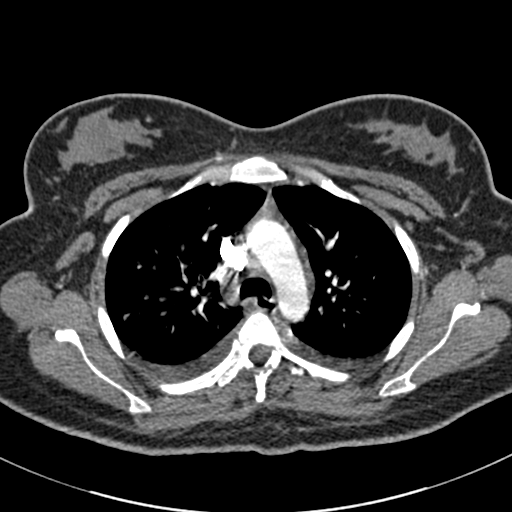
[im 238/289  lung]
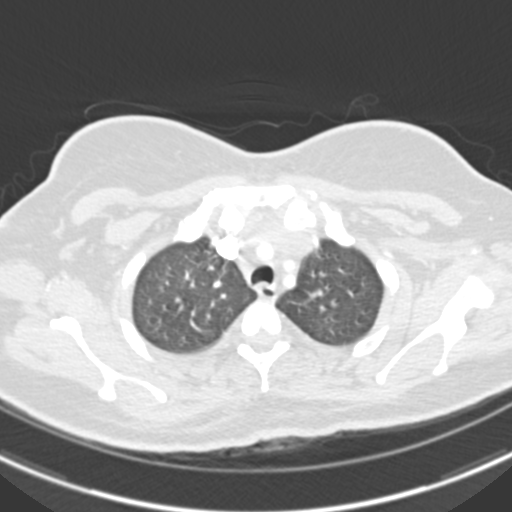
[im 251/289  soft-tissue]
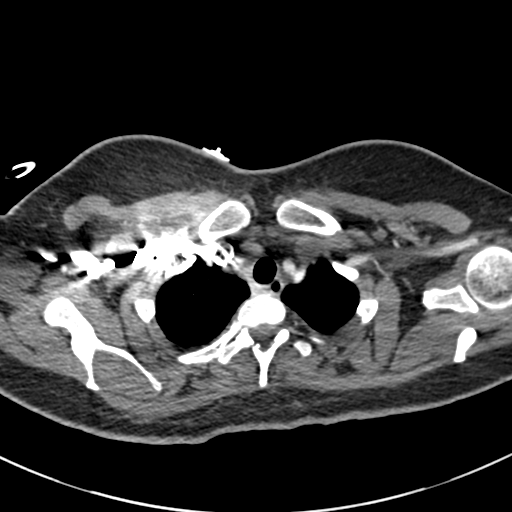
[im 276/289  lung]
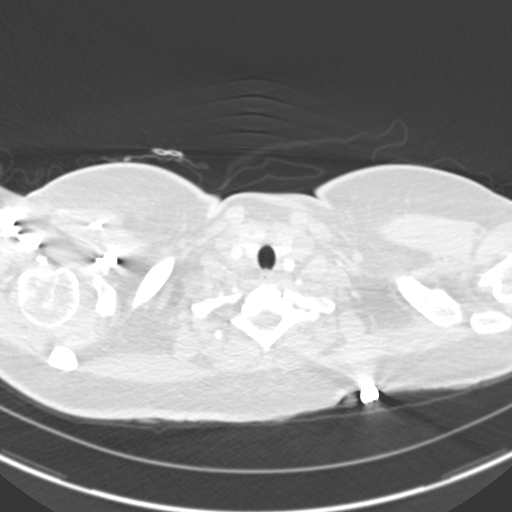

[Series 7: coronal mpr · coronal · 0.58mm/px · 3 of 90 slices shown]
[im 23/90  soft-tissue]
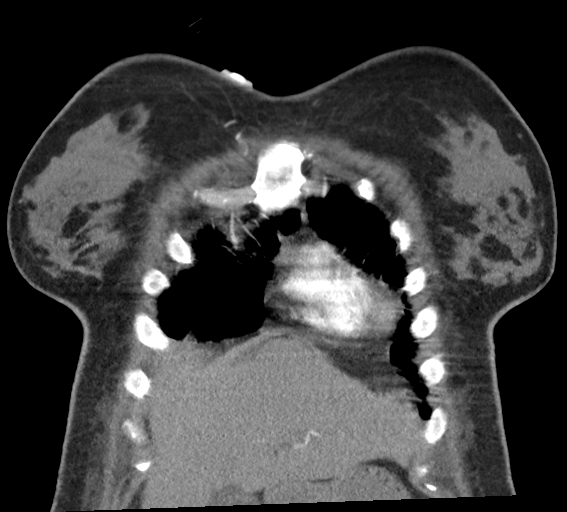
[im 45/90  soft-tissue]
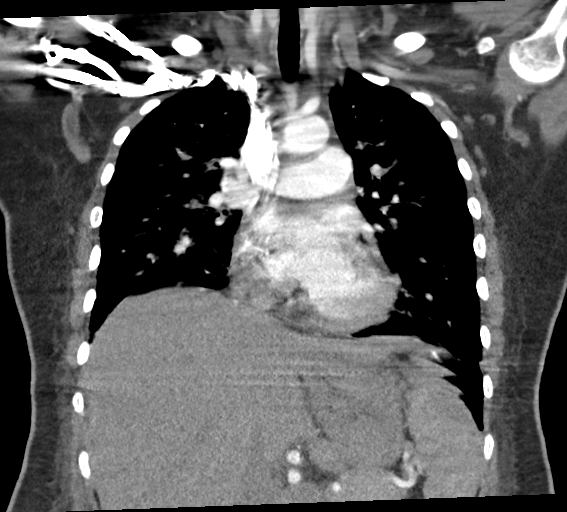
[im 67/90  soft-tissue]
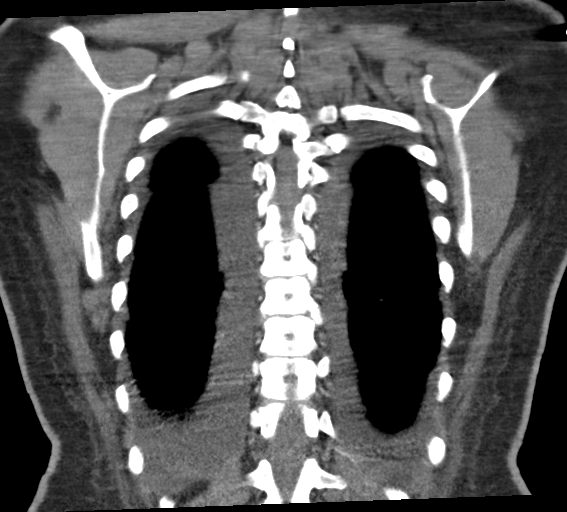

[18 of 46 positions shown; findings below may reference images not displayed]

FINDINGS: Cardiovascular: There is satisfactory opacification of the pulmonary
arteries to the segmental level. Study is somewhat degraded by
respiratory motion. Allowing for this, there is no evidence of a
pulmonary embolism.

Heart is normal in size and configuration. No coronary artery
calcifications. No pericardial effusion. Great vessels are within
normal limits.

Mediastinum/Nodes: No enlarged mediastinal, hilar, or axillary lymph
nodes. Thyroid gland, trachea, and esophagus demonstrate no
significant findings.

Lungs/Pleura: Small bilateral pleural effusions. Bilateral
interstitial thickening. Ill-defined patchy areas of
peribronchovascular ground-glass opacity are noted which predominate
centrally. No pneumothorax.

Upper Abdomen: No acute abnormality..

Musculoskeletal: No chest wall abnormality. No acute or significant
osseous findings.

Review of the MIP images confirms the above findings.
IMPRESSION: 1. No evidence of a pulmonary embolism. Study somewhat degraded by
respiratory motion.
2. Interstitial thickening, patchy, centrally predominant,
peribronchovascular, ill-defined ground-glass lung opacities with
associated small pleural effusions. Findings are consistent with
noncardiogenic pulmonary edema.

## 2018-06-02 ENCOUNTER — Encounter (INDEPENDENT_AMBULATORY_CARE_PROVIDER_SITE_OTHER): Payer: Self-pay

## 2018-06-02 IMAGING — CR DG CHEST 2V
1 series · 3 of 3 positions shown · non-contrast
Comparison: 02/14/2018

CLINICAL DATA: Treated last week and admitted for sepsis. Currently
on Keflex. Fever.

EXAM:
CHEST - 2 VIEW

[Series 1: dg chest 2 view · 0.14mm/px · 3 of 3 slices shown]
[im 1/3]
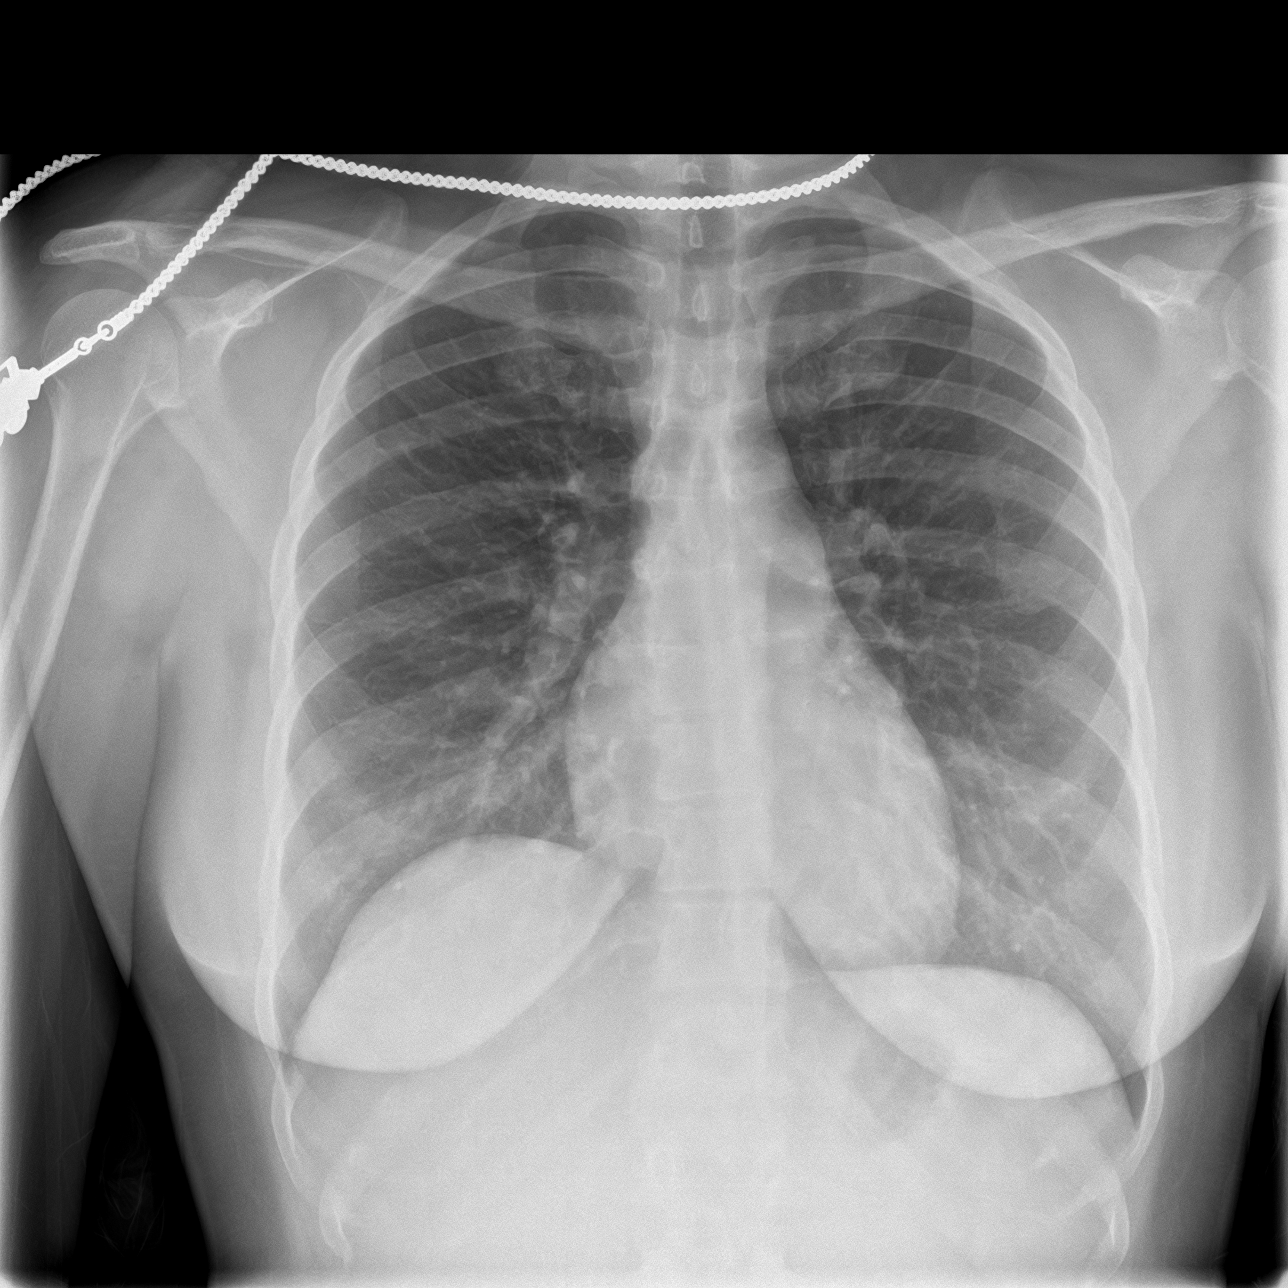
[im 2/3]
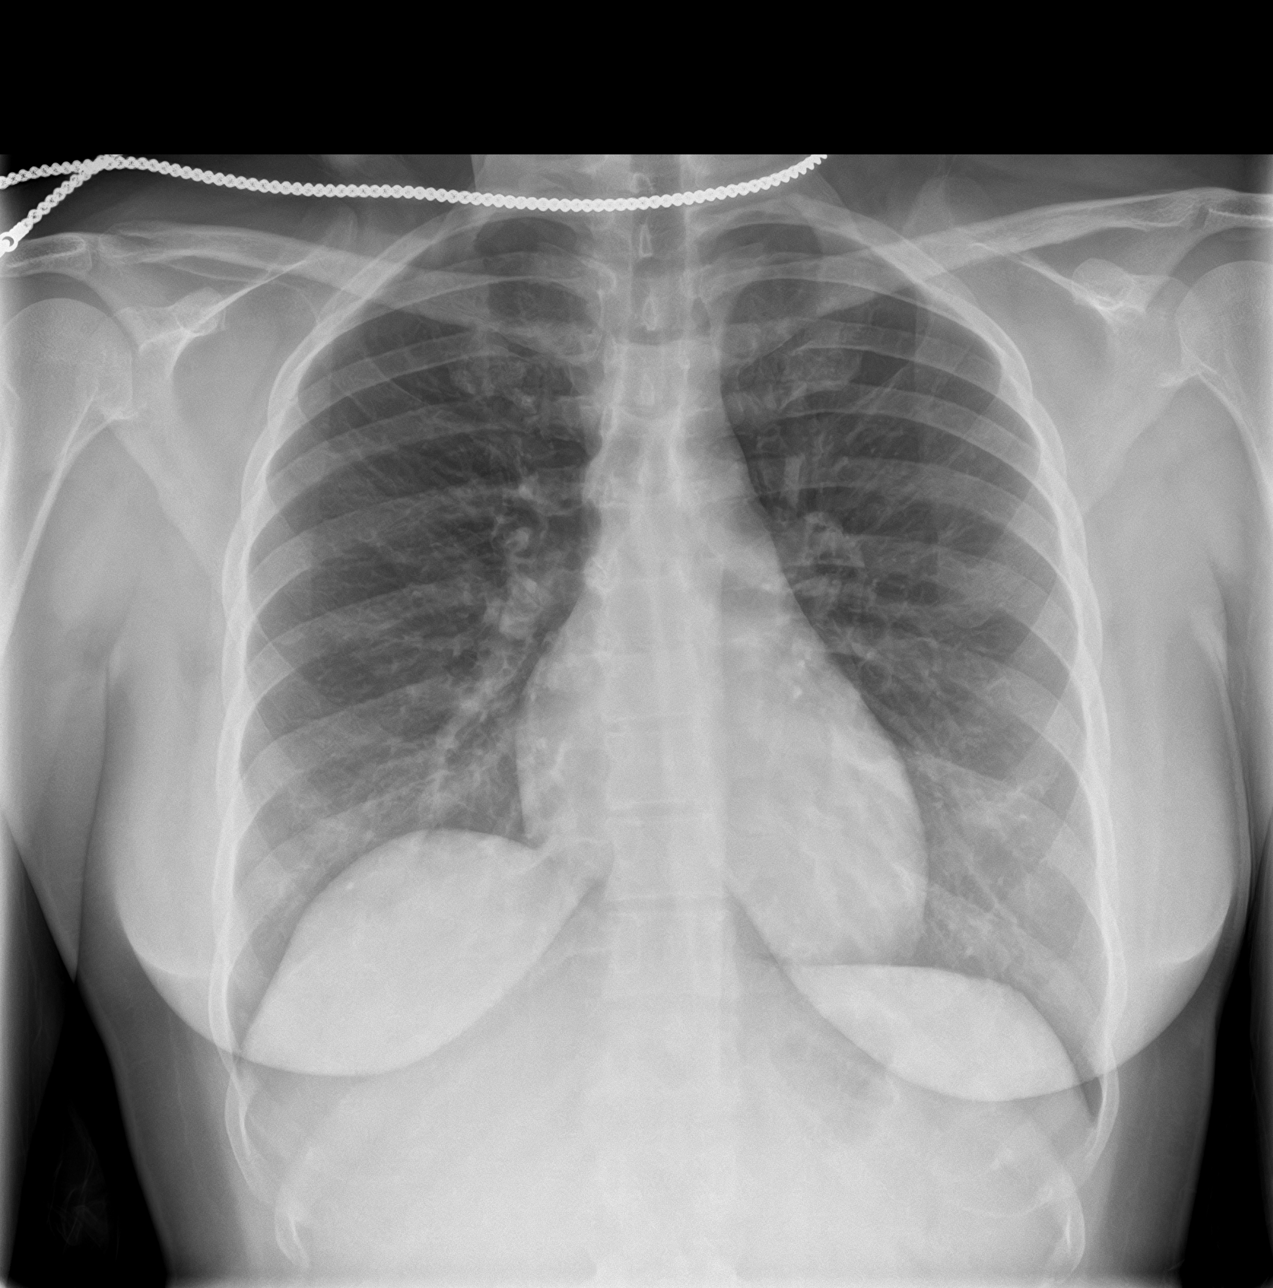
[im 3/3]
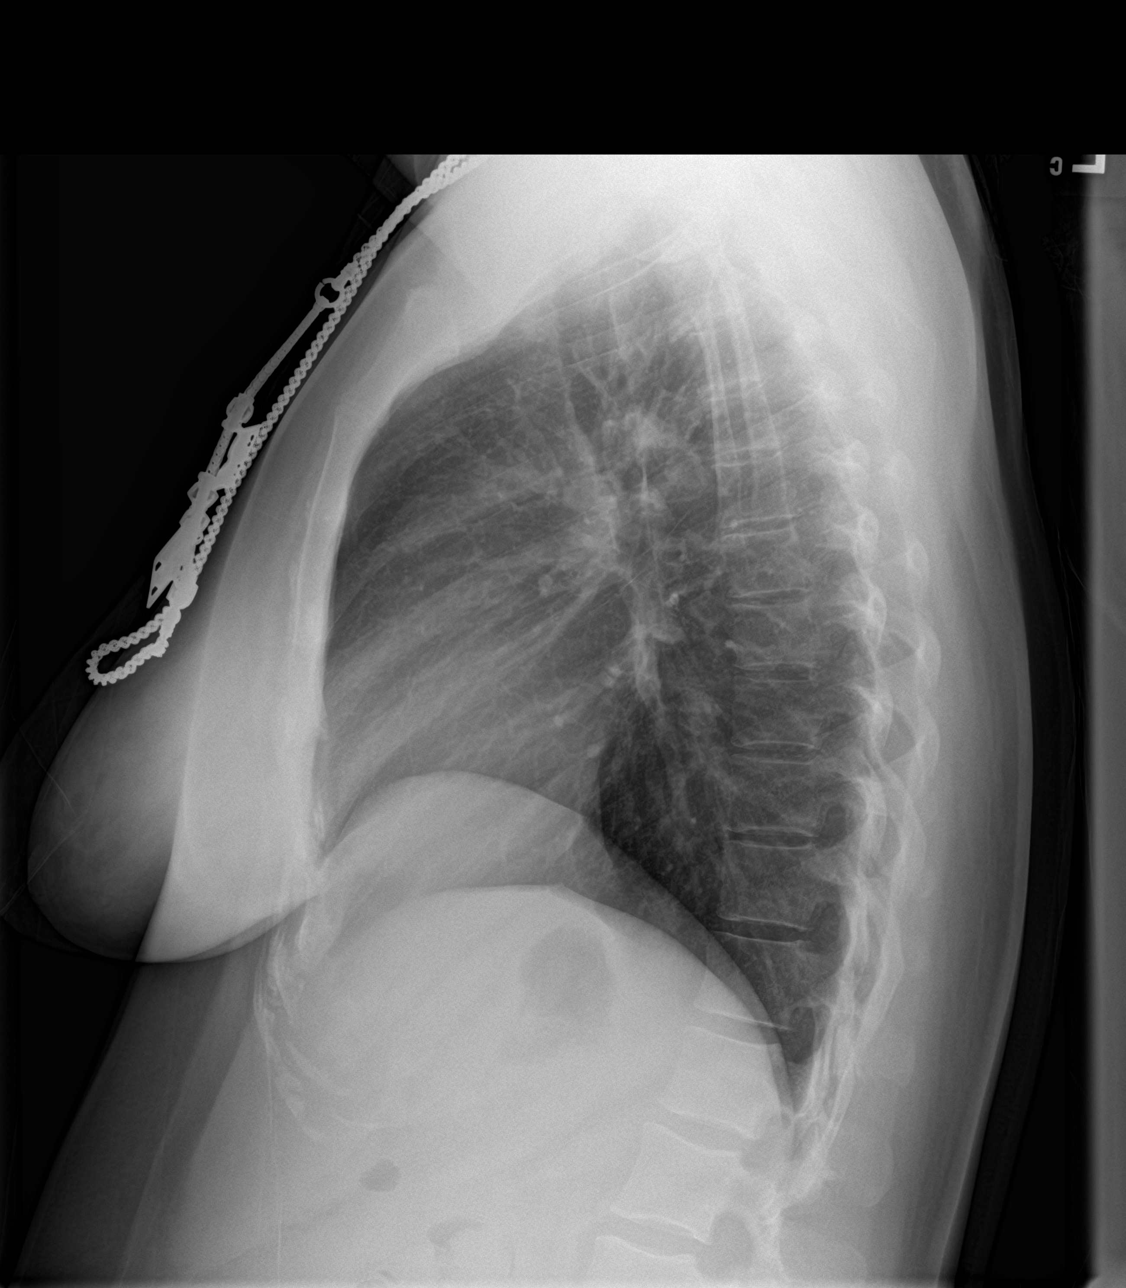

[3 of 3 positions shown; findings below may reference images not displayed]

FINDINGS: Pulmonary infiltrates seen on the previous study have almost
completely resolved with minimal residual in the bases. No focal
consolidation. No blunting of costophrenic angles. No pneumothorax.
Mediastinal contours appear intact. Normal heart size and pulmonary
vascularity.
IMPRESSION: Near complete resolution of previous pulmonary infiltrates.

## 2018-06-14 ENCOUNTER — Encounter: Payer: 59 | Admitting: Obstetrics and Gynecology

## 2018-06-22 ENCOUNTER — Encounter: Payer: 59 | Admitting: Obstetrics and Gynecology

## 2018-06-24 ENCOUNTER — Other Ambulatory Visit (HOSPITAL_COMMUNITY)
Admission: RE | Admit: 2018-06-24 | Discharge: 2018-06-24 | Disposition: A | Discharge status: Home / Self Care | Payer: 59 | Source: Ambulatory Visit | Attending: Obstetrics and Gynecology | Admitting: Obstetrics and Gynecology

## 2018-06-24 ENCOUNTER — Ambulatory Visit (INDEPENDENT_AMBULATORY_CARE_PROVIDER_SITE_OTHER): Payer: 59 | Admitting: Obstetrics and Gynecology

## 2018-06-24 ENCOUNTER — Encounter: Payer: Self-pay | Admitting: Obstetrics and Gynecology

## 2018-06-24 ENCOUNTER — Other Ambulatory Visit: Payer: Self-pay

## 2018-06-24 VITALS — BP 112/80 | HR 89 | Ht 63.0 in | Wt 175.0 lb

## 2018-06-24 DIAGNOSIS — Z0000 Encounter for general adult medical examination without abnormal findings: Secondary | ICD-10-CM

## 2018-06-24 DIAGNOSIS — E28319 Asymptomatic premature menopause: Secondary | ICD-10-CM

## 2018-06-24 DIAGNOSIS — Z79890 Hormone replacement therapy (postmenopausal): Secondary | ICD-10-CM | POA: Diagnosis not present

## 2018-06-24 LAB — POCT URINALYSIS DIPSTICK
Bilirubin, UA: NEGATIVE
Glucose, UA: NEGATIVE
Ketones, UA: NEGATIVE
LEUKOCYTES UA: NEGATIVE
NITRITE UA: NEGATIVE
PH UA: 6 (ref 5.0–8.0)
PROTEIN UA: NEGATIVE
RBC UA: NEGATIVE
Spec Grav, UA: 1.02 (ref 1.010–1.025)
UROBILINOGEN UA: 0.2 U/dL

## 2018-06-24 MED ORDER — LEVONORGEST-ETH ESTRAD 91-DAY 0.15-0.03 &0.01 MG PO TABS: 1.0000 | ORAL_TABLET | Freq: Every day | ORAL | 3 refills | Status: DC

## 2018-06-24 NOTE — Progress Notes (Signed)
HPI:      [name] is a 32 y.o. G1P0101 who LMP was No LMP recorded.  Subjective:   She presents today for her annual examination.  She has been taking intermittent Premarin.  She stopped breast-feeding.  She has not resumed intercourse and has no desire to begin at this time.  She has a history of premature menopause and previously conceived with advanced reproductive technology.  She states she also has been found to have hypothyroidism in the past.  She has some concerns regarding loss of hair. This is her annual examination.    Hx: The following portions of the patient's history were reviewed and updated as appropriate:             She  has a past medical history of Diabetes mellitus without complication (HCC), Hormone replacement therapy, Hypertension, and Premature ovarian failure. She does not have any pertinent problems on file. She  has a past surgical history that includes Appendectomy and Dilation and curettage of uterus (N/A, 02/24/2018). Her family history includes Diabetes in her mother; Hypertension in her mother. She  reports that she has never smoked. She has never used smokeless tobacco. She reports that she does not drink alcohol or use drugs. She has a current medication list which includes the following prescription(s): metformin, prenatal vitamin, and levonorgestrel-ethinyl estradiol. She has No Known Allergies.       Review of Systems:  Review of Systems  Constitutional: Denied constitutional symptoms, night sweats, recent illness, fatigue, fever, insomnia and weight loss.  Eyes: Denied eye symptoms, eye pain, photophobia, vision change and visual disturbance.  Ears/Nose/Throat/Neck: Denied ear, nose, throat or neck symptoms, hearing loss, nasal discharge, sinus congestion and sore throat.  Cardiovascular: Denied cardiovascular symptoms, arrhythmia, chest pain/pressure, edema, exercise intolerance, orthopnea and palpitations.  Respiratory: Denied pulmonary  symptoms, asthma, pleuritic pain, productive sputum, cough, dyspnea and wheezing.  Gastrointestinal: Denied, gastro-esophageal reflux, melena, nausea and vomiting.  Genitourinary: Denied genitourinary symptoms including symptomatic vaginal discharge, pelvic relaxation issues, and urinary complaints.  Musculoskeletal: Denied musculoskeletal symptoms, stiffness, swelling, muscle weakness and myalgia.  Dermatologic: See HPI for additional information.  Neurologic: Denied neurology symptoms, dizziness, headache, neck pain and syncope.  Psychiatric: Denied psychiatric symptoms, anxiety and depression.  Endocrine: Denied endocrine symptoms including hot flashes and night sweats.   Meds:   Current Outpatient Medications on File Prior to Visit  Medication Sig Dispense Refill  . metFORMIN (GLUCOPHAGE) 500 MG tablet Take 1 tablet (500 mg total) by mouth 2 (two) times daily with a meal. 60 tablet 3  . Prenatal Vit-Fe Fumarate-FA (PRENATAL VITAMIN) 27-0.8 MG TABS Take 1 tablet by mouth daily.      No current facility-administered medications on file prior to visit.     Objective:     Vitals:   06/24/18 0833  BP: 112/80  Pulse: 89              Physical examination General NAD, Conversant  HEENT Atraumatic; Op clear with mmm.  Normo-cephalic. Pupils reactive. Anicteric sclerae  Thyroid/Neck Smooth without nodularity or enlargement. Normal ROM.  Neck Supple.  Skin No rashes, lesions or ulceration. Normal palpated skin turgor. No nodularity.  Breasts: No masses or discharge.  Symmetric.  No axillary adenopathy.  Lungs: Clear to auscultation.No rales or wheezes. Normal Respiratory effort, no retractions.  Heart: NSR.  No murmurs or rubs appreciated. No periferal edema  Abdomen: Soft.  Non-tender.  No masses.  No HSM. No hernia  Extremities: Moves all appropriately.  Normal ROM for age. No lymphadenopathy.  Neuro: Oriented to PPT.  Normal mood. Normal affect.     Pelvic:   Vulva: Normal  appearance.  No lesions.  Vagina: No lesions or abnormalities noted.  Support: Normal pelvic support.  Urethra No masses tenderness or scarring.  Meatus Normal size without lesions or prolapse.  Cervix: Normal appearance.  No lesions.  Anus: Normal exam.  No lesions.  Perineum: Normal exam.  No lesions.        Bimanual   Uterus: Normal size.  Non-tender.  Mobile.  AV.  Adnexae: No masses.  Non-tender to palpation.  Cul-de-sac: Negative for abnormality.      Assessment:    G1P0101 Patient Active Problem List   Diagnosis Date Noted  . Blood loss anemia 02/23/2018  . History of pre-eclampsia 02/16/2018  . Sepsis (HCC) 02/15/2018  . Postpartum endometritis 02/13/2018  . Labor and delivery indication for care or intervention 01/31/2018  . PIH (pregnancy induced hypertension) 01/31/2018  . Pregnancy 01/29/2018  . Proteinuria affecting pregnancy, antepartum 01/28/2018  . Elevated blood pressure affecting pregnancy, antepartum 01/28/2018  . Preeclampsia, third trimester 01/28/2018  . 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 Pregnancy resulting from in vitro fertilization 09/17/2017  . Type 2 diabetes mellitus in pregnancy, third trimester 09/17/2017  . Obesity (BMI 30.0-34.9) 08/20/2017  . Type 2 diabetes mellitus without complication, without long-term current use of insulin (HCC) 11/26/2016  . 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 Annual physical exam   2. Premature menopause on HRT     History of premature menopause.  History of diabetes.  Possible history of hypothyroidism.   Plan:            1.  Basic Screening Recommendations The basic screening recommendations for asymptomatic women were discussed with the patient during her visit.  The age-appropriate recommendations were discussed with her and the rational for the tests reviewed.  When I am informed by the patient that another primary care physician has  previously obtained the age-appropriate tests and they are up-to-date, only outstanding tests are ordered and referrals given as necessary.  Abnormal results of tests will be discussed with her when all of her results are completed. Pap performed 2.  Hemoglobin A1c, TSH, FSH/LH, CBC. 3.  Expectation that patient will require OCPs.  [facility].  (It is possible that her FSH and LH will be normal because of her current Premarin use). 4.  Will stop Premarin use and begin OCPs.  Orders Orders Placed This Encounter  Procedures  . CBC with Differential/Platelet  . FSH/LH  . TSH  . Hemoglobin A1c  . POCT urinalysis dipstick     Meds ordered this encounter  Medications  . Levonorgestrel-Ethinyl Estradiol (AMETHIA,CAMRESE) 0.15-0.03 &0.01 MG tablet    Sig: Take 1 tablet by mouth at bedtime.    Dispense:  84 tablet    Refill:  3        F/U  Return for We will contact her with any abnormal test results.  Elonda Huskyavid J. Nada Godley, M.D. 06/24/2018 9:21 AM

## 2018-06-24 NOTE — Progress Notes (Signed)
Pt states she wants blood work done for diabetes and hemoglobin, she is losing her hair.

## 2018-06-25 LAB — TSH: TSH: 2.89 u[IU]/mL (ref 0.450–4.500)

## 2018-06-25 LAB — HEMOGLOBIN A1C
ESTIMATED AVERAGE GLUCOSE: 220 mg/dL
HEMOGLOBIN A1C: 9.3 % — AB (ref 4.8–5.6)

## 2018-06-25 LAB — CBC WITH DIFFERENTIAL/PLATELET
Basophils Absolute: 0 10*3/uL (ref 0.0–0.2)
Basos: 0 %
EOS (ABSOLUTE): 0.1 10*3/uL (ref 0.0–0.4)
Eos: 2 %
Hematocrit: 36.6 % (ref 34.0–46.6)
Hemoglobin: 12.2 g/dL (ref 11.1–15.9)
IMMATURE GRANS (ABS): 0 10*3/uL (ref 0.0–0.1)
IMMATURE GRANULOCYTES: 0 %
LYMPHS: 35 %
Lymphocytes Absolute: 2.7 10*3/uL (ref 0.7–3.1)
MCH: 26.5 pg — ABNORMAL LOW (ref 26.6–33.0)
MCHC: 33.3 g/dL (ref 31.5–35.7)
MCV: 79 fL (ref 79–97)
MONOS ABS: 0.3 10*3/uL (ref 0.1–0.9)
Monocytes: 4 %
NEUTROS PCT: 59 %
Neutrophils Absolute: 4.7 10*3/uL (ref 1.4–7.0)
PLATELETS: 268 10*3/uL (ref 150–450)
RBC: 4.61 x10E6/uL (ref 3.77–5.28)
RDW: 14.6 % (ref 12.3–15.4)
WBC: 7.9 10*3/uL (ref 3.4–10.8)

## 2018-06-25 LAB — FSH/LH
FSH: 68.8 m[IU]/mL
LH: 33.5 m[IU]/mL

## 2018-06-28 ENCOUNTER — Telehealth: Payer: Self-pay

## 2018-06-28 LAB — CYTOLOGY - PAP: DIAGNOSIS: NEGATIVE

## 2018-06-28 NOTE — Telephone Encounter (Signed)
Informed pt of results and advised her to make an appt to discuss further with Dr. Logan BoresEvans, in which she complied.

## 2018-07-05 ENCOUNTER — Encounter: Payer: Self-pay | Admitting: Obstetrics and Gynecology

## 2018-07-05 ENCOUNTER — Ambulatory Visit (INDEPENDENT_AMBULATORY_CARE_PROVIDER_SITE_OTHER): Payer: 59 | Admitting: Obstetrics and Gynecology

## 2018-07-05 VITALS — BP 117/81 | HR 76 | Ht 63.0 in | Wt 175.0 lb

## 2018-07-05 DIAGNOSIS — E119 Type 2 diabetes mellitus without complications: Secondary | ICD-10-CM

## 2018-07-05 DIAGNOSIS — Z30011 Encounter for initial prescription of contraceptive pills: Secondary | ICD-10-CM

## 2018-07-05 NOTE — Progress Notes (Signed)
HPI:      [name] is a 32 y.o. G1P0101 who LMP was No LMP recorded.  Subjective:   She presents today for follow-up of her blood work revealing premature ovarian failure and diabetes.  Patient was aware of this because she achieved pregnancy last time with assisted reproductive methods. She is asymptomatic with her hypo-estrogen state. She does not have a family doctor or endocrinologist to manage her diabetes.  She achieved good control with metformin prior to previous pregnancy.  She has once again restarted metformin at 500 mg twice daily.    Hx: The following portions of the patient's history were reviewed and updated as appropriate:             She  has a past medical history of Diabetes mellitus without complication (HCC), Hormone replacement therapy, Hypertension, and Premature ovarian failure. She does not have any pertinent problems on file. She  has a past surgical history that includes Appendectomy and Dilation and curettage of uterus (N/A, 02/24/2018). Her family history includes Diabetes in her mother; Hypertension in her mother. She  reports that she has never smoked. She has never used smokeless tobacco. She reports that she does not drink alcohol or use drugs. She has a current medication list which includes the following prescription(s): levonorgestrel-ethinyl estradiol, metformin, and prenatal vitamin. She has No Known Allergies.       Review of Systems:  Review of Systems  Constitutional: Denied constitutional symptoms, night sweats, recent illness, fatigue, fever, insomnia and weight loss.  Eyes: Denied eye symptoms, eye pain, photophobia, vision change and visual disturbance.  Ears/Nose/Throat/Neck: Denied ear, nose, throat or neck symptoms, hearing loss, nasal discharge, sinus congestion and sore throat.  Cardiovascular: Denied cardiovascular symptoms, arrhythmia, chest pain/pressure, edema, exercise intolerance, orthopnea and palpitations.  Respiratory:  Denied pulmonary symptoms, asthma, pleuritic pain, productive sputum, cough, dyspnea and wheezing.  Gastrointestinal: Denied, gastro-esophageal reflux, melena, nausea and vomiting.  Genitourinary: Denied genitourinary symptoms including symptomatic vaginal discharge, pelvic relaxation issues, and urinary complaints.  Musculoskeletal: Denied musculoskeletal symptoms, stiffness, swelling, muscle weakness and myalgia.  Dermatologic: Denied dermatology symptoms, rash and scar.  Neurologic: Denied neurology symptoms, dizziness, headache, neck pain and syncope.  Psychiatric: Denied psychiatric symptoms, anxiety and depression.  Endocrine: Denied endocrine symptoms including hot flashes and night sweats.   Meds:   Current Outpatient Medications on File Prior to Visit  Medication Sig Dispense Refill  . Levonorgestrel-Ethinyl Estradiol (AMETHIA,CAMRESE) 0.15-0.03 &0.01 MG tablet Take 1 tablet by mouth at bedtime. 84 tablet 3  . metFORMIN (GLUCOPHAGE) 500 MG tablet Take 1 tablet (500 mg total) by mouth 2 (two) times daily with a meal. 60 tablet 3  . Prenatal Vit-Fe Fumarate-FA (PRENATAL VITAMIN) 27-0.8 MG TABS Take 1 tablet by mouth daily.      No current facility-administered medications on file prior to visit.     Objective:     Vitals:   07/05/18 0812  BP: 117/81  Pulse: 76                Assessment:    G1P0101 Patient Active Problem List   Diagnosis Date Noted  . Blood loss anemia 02/23/2018  . History of pre-eclampsia 02/16/2018  . Sepsis (HCC) 02/15/2018  . Postpartum endometritis 02/13/2018  . Labor and delivery indication for care or intervention 01/31/2018  . PIH (pregnancy induced hypertension) 01/31/2018  . Pregnancy 01/29/2018  . Proteinuria affecting pregnancy, antepartum 01/28/2018  . Elevated blood pressure affecting pregnancy, antepartum 01/28/2018  . Preeclampsia, third  trimester 01/28/2018  . 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 Pregnancy resulting  from in vitro fertilization 09/17/2017  . Type 2 diabetes mellitus in pregnancy, third trimester 09/17/2017  . Obesity (BMI 30.0-34.9) 08/20/2017  . Type 2 diabetes mellitus without complication, without long-term current use of insulin (HCC) 11/26/2016  . 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 Initiation of OCP (BCP)   2. Type 2 diabetes mellitus without complication, without long-term current use of insulin (HCC)     Patient would like to start OCPs for HRT.  She did this prior to her previous pregnancy and had no problems with it.  Use of OCPs discussed, possible menopausal side effects during the off week reviewed.  Patient currently taking metformin 500 twice daily and has been more consistent about her diet and her use of the medication since she noted her elevated A1c.   Plan:            1.  OCPs for HRT  2.  Continue Metformin 500 twice daily.  Patient to begin fingerstick glucose checks twice daily as directed.  If she notes poor control we will increase her dose.  She is to contact me.  Otherwise hemoglobin A1c in 3 months to recheck long-term control. Orders No orders of the defined types were placed in this encounter.   No orders of the defined types were placed in this encounter.     F/U  Return for Annual Physical. I spent 20 minutes involved in the care of this patient of which greater than 50% was spent discussing use of OCPs for HRT and goals and methods of diabetic control.  All questions answered.  Elonda Huskyavid J. Evans, M.D. 07/05/2018 8:47 AM

## 2018-07-05 NOTE — Progress Notes (Signed)
Pt presents today to discuss test results. Pt has no new concerns.

## 2019-01-11 ENCOUNTER — Other Ambulatory Visit: Payer: Self-pay | Admitting: Obstetrics and Gynecology

## 2019-07-05 ENCOUNTER — Other Ambulatory Visit: Payer: Self-pay | Admitting: Obstetrics and Gynecology

## 2019-07-05 DIAGNOSIS — Z79890 Hormone replacement therapy (postmenopausal): Secondary | ICD-10-CM

## 2019-07-05 DIAGNOSIS — Z0000 Encounter for general adult medical examination without abnormal findings: Secondary | ICD-10-CM

## 2019-07-05 DIAGNOSIS — E28319 Asymptomatic premature menopause: Secondary | ICD-10-CM

## 2019-09-05 ENCOUNTER — Other Ambulatory Visit: Payer: Self-pay

## 2019-09-05 ENCOUNTER — Encounter: Payer: Self-pay | Admitting: Obstetrics and Gynecology

## 2019-09-05 ENCOUNTER — Ambulatory Visit (INDEPENDENT_AMBULATORY_CARE_PROVIDER_SITE_OTHER): Payer: 59 | Admitting: Obstetrics and Gynecology

## 2019-09-05 VITALS — BP 121/86 | HR 76 | Wt 154.1 lb

## 2019-09-05 DIAGNOSIS — E28319 Asymptomatic premature menopause: Secondary | ICD-10-CM

## 2019-09-05 DIAGNOSIS — Z79890 Hormone replacement therapy (postmenopausal): Secondary | ICD-10-CM

## 2019-09-05 DIAGNOSIS — N921 Excessive and frequent menstruation with irregular cycle: Secondary | ICD-10-CM

## 2019-09-05 MED ORDER — DESOGESTREL-ETHINYL ESTRADIOL 0.15-0.02/0.01 MG (21/5) PO TABS: 1.0000 | ORAL_TABLET | Freq: Every day | ORAL | 1 refills | Status: DC

## 2019-09-05 NOTE — Progress Notes (Signed)
Patient comes in today for irregular;ar periods. She is bleeding the last last three weeks of the three month birth control pill.

## 2019-09-05 NOTE — Progress Notes (Signed)
HPI:      [name] is a 33 y.o. G1P0101 who LMP was No LMP recorded. (Menstrual status: Oral contraceptives).  Subjective:   She presents today with complaint of breakthrough bleeding while taking OCPs.  She has been taking Seasonique but bleeds the last 3 weeks of each 39-month pack.  She says that the last week and a half is heavy.  In addition she would like to have a monthly menses as she says it is her "culture" and her elders would prefer she had a monthly menses.  She is not planning to tell them about premature ovarian failure.    Hx: The following portions of the patient's history were reviewed and updated as appropriate:             She  has a past medical history of Diabetes mellitus without complication ([place]), Hormone replacement therapy, Hypertension, and Premature ovarian failure. She does not have any pertinent problems on file. She  has a past surgical history that includes Appendectomy and Dilation and curettage of uterus (N/A, 02/24/2018). Her family history includes Diabetes in her mother; Hypertension in her mother. She  reports that she has never smoked. She has never used smokeless tobacco. She reports that she does not drink alcohol or use drugs. She has a current medication list which includes the following prescription(s): ashlyna, metformin, and prenatal vitamin. She has No Known Allergies.       Review of Systems:  Review of Systems  Constitutional: Denied constitutional symptoms, night sweats, recent illness, fatigue, fever, insomnia and weight loss.  Eyes: Denied eye symptoms, eye pain, photophobia, vision change and visual disturbance.  Ears/Nose/Throat/Neck: Denied ear, nose, throat or neck symptoms, hearing loss, nasal discharge, sinus congestion and sore throat.  Cardiovascular: Denied cardiovascular symptoms, arrhythmia, chest pain/pressure, edema, exercise intolerance, orthopnea and palpitations.  Respiratory: Denied pulmonary symptoms, asthma,  pleuritic pain, productive sputum, cough, dyspnea and wheezing.  Gastrointestinal: Denied, gastro-esophageal reflux, melena, nausea and vomiting.  Genitourinary: See HPI for additional information.  Musculoskeletal: Denied musculoskeletal symptoms, stiffness, swelling, muscle weakness and myalgia.  Dermatologic: Denied dermatology symptoms, rash and scar.  Neurologic: Denied neurology symptoms, dizziness, headache, neck pain and syncope.  Psychiatric: Denied psychiatric symptoms, anxiety and depression.  Endocrine: Denied endocrine symptoms including hot flashes and night sweats.   Meds:   Current Outpatient Medications on File Prior to Visit  Medication Sig Dispense Refill  . ASHLYNA 0.15-0.03 &0.01 MG tablet TAKE 1 TABLET BY MOUTH AT BEDTIME 91 tablet 0  . metFORMIN (GLUCOPHAGE) 500 MG tablet TAKE 1 TABLET BY MOUTH TWICE DAILY WITH A MEAL 60 tablet 3  . Prenatal Vit-Fe Fumarate-FA (PRENATAL VITAMIN) 27-0.8 MG TABS Take 1 tablet by mouth daily.      No current facility-administered medications on file prior to visit.     Objective:     Vitals:   09/05/19 0918  BP: 121/86  Pulse: 76                Assessment:    G1P0101 Patient Active Problem List   Diagnosis Date Noted  . Blood loss anemia 02/23/2018  . History of pre-eclampsia 02/16/2018  . Sepsis ([place]) 02/15/2018  . Postpartum endometritis 02/13/2018  . Labor and delivery indication for care or intervention 01/31/2018  . PIH (pregnancy induced hypertension) 01/31/2018  . Pregnancy 01/29/2018  . Proteinuria affecting pregnancy, antepartum 01/28/2018  . Elevated blood pressure affecting pregnancy, antepartum 01/28/2018  . Preeclampsia, third trimester 01/28/2018  . 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 Pregnancy resulting from in vitro fertilization 09/17/2017  . Type 2 diabetes mellitus in pregnancy, third trimester 09/17/2017  . Obesity (BMI 30.0-34.9) 08/20/2017  . Type 2 diabetes mellitus without  complication, without long-term current use of insulin ([facility]) 11/26/2016  . 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 Breakthrough bleeding on OCPs   2. Premature menopause on HRT        Plan:            1.  Discussed multiple options with the patient and she is elected to take OCPs with a monthly cycle.  Mircette prescribed. Orders No orders of the defined types were placed in this encounter.   No orders of the defined types were placed in this encounter.     F/U  Return in about 1 month (around 10/06/2019) for Annual Physical. I spent 16 minutes involved in the care of this patient of which greater than 50% was spent discussing breakthrough bleeding on OCPs, multiple options including continuous OCPs, monthly cyclic OCPs and other forms of HRT.  All questions answered.  Elonda Husky, M.D. 09/05/2019 9:51 AM

## 2020-03-10 ENCOUNTER — Other Ambulatory Visit: Payer: Self-pay | Admitting: Obstetrics and Gynecology

## 2020-03-10 DIAGNOSIS — Z79890 Hormone replacement therapy (postmenopausal): Secondary | ICD-10-CM

## 2020-03-10 DIAGNOSIS — E28319 Asymptomatic premature menopause: Secondary | ICD-10-CM

## 2020-03-10 DIAGNOSIS — N921 Excessive and frequent menstruation with irregular cycle: Secondary | ICD-10-CM

## 2020-08-29 ENCOUNTER — Other Ambulatory Visit: Payer: Self-pay | Admitting: Obstetrics and Gynecology

## 2020-08-29 DIAGNOSIS — N921 Excessive and frequent menstruation with irregular cycle: Secondary | ICD-10-CM

## 2020-08-29 DIAGNOSIS — E28319 Asymptomatic premature menopause: Secondary | ICD-10-CM

## 2020-08-29 DIAGNOSIS — Z79890 Hormone replacement therapy (postmenopausal): Secondary | ICD-10-CM
